# Patient Record
Sex: Female | Born: 1970 | Race: Black or African American | Hispanic: No | Marital: Single | Smoking: Former smoker
Health system: Southern US, Community
[De-identification: ages and names within clinical notes are randomized; demographics above are authoritative.]

## PROBLEM LIST (undated history)

## (undated) DIAGNOSIS — O24419 Gestational diabetes mellitus in pregnancy, unspecified control: Secondary | ICD-10-CM

## (undated) DIAGNOSIS — G500 Trigeminal neuralgia: Secondary | ICD-10-CM

## (undated) DIAGNOSIS — I10 Essential (primary) hypertension: Secondary | ICD-10-CM

## (undated) DIAGNOSIS — Z6841 Body Mass Index (BMI) 40.0 and over, adult: Secondary | ICD-10-CM

## (undated) DIAGNOSIS — J189 Pneumonia, unspecified organism: Secondary | ICD-10-CM

## (undated) DIAGNOSIS — Z8742 Personal history of other diseases of the female genital tract: Secondary | ICD-10-CM

## (undated) HISTORY — PX: LAPAROSCOPIC TUBAL LIGATION: SHX1937

---

## 2009-04-11 ENCOUNTER — Inpatient Hospital Stay (HOSPITAL_COMMUNITY): Admission: AD | Admit: 2009-04-11 | Discharge: 2009-04-11 | Payer: Self-pay | Admitting: Obstetrics and Gynecology

## 2009-05-31 ENCOUNTER — Ambulatory Visit: Payer: Self-pay | Admitting: Obstetrics and Gynecology

## 2009-05-31 ENCOUNTER — Inpatient Hospital Stay (HOSPITAL_COMMUNITY): Admission: AD | Admit: 2009-05-31 | Discharge: 2009-05-31 | Payer: Self-pay | Admitting: Obstetrics & Gynecology

## 2009-06-05 ENCOUNTER — Encounter: Payer: Self-pay | Admitting: Family

## 2009-06-05 ENCOUNTER — Ambulatory Visit: Payer: Self-pay | Admitting: Obstetrics & Gynecology

## 2009-06-05 LAB — CONVERTED CEMR LAB
Basophils Absolute: 0 10*3/uL (ref 0.0–0.1)
Basophils Relative: 0 % (ref 0–1)
Eosinophils Absolute: 0.1 10*3/uL (ref 0.0–0.7)
Hepatitis B Surface Ag: NEGATIVE
MCHC: 31.4 g/dL (ref 30.0–36.0)
MCV: 86.6 fL (ref 78.0–100.0)
Monocytes Relative: 4 % (ref 3–12)
Neutro Abs: 6.4 10*3/uL (ref 1.7–7.7)
Neutrophils Relative %: 73 % (ref 43–77)
Platelets: 303 10*3/uL (ref 150–400)
RBC: 3.67 M/uL — ABNORMAL LOW (ref 3.87–5.11)

## 2009-06-06 ENCOUNTER — Ambulatory Visit (HOSPITAL_COMMUNITY): Admission: RE | Admit: 2009-06-06 | Discharge: 2009-06-06 | Payer: Self-pay

## 2009-06-12 ENCOUNTER — Encounter: Payer: Self-pay | Admitting: Obstetrics and Gynecology

## 2009-06-12 ENCOUNTER — Ambulatory Visit: Payer: Self-pay | Admitting: Obstetrics & Gynecology

## 2009-06-12 LAB — CONVERTED CEMR LAB
Alkaline Phosphatase: 45 units/L (ref 39–117)
BUN: 5 mg/dL — ABNORMAL LOW (ref 6–23)
CO2: 22 meq/L (ref 19–32)
Creatinine, Ser: 0.59 mg/dL (ref 0.40–1.20)
Glucose, Bld: 106 mg/dL — ABNORMAL HIGH (ref 70–99)
HCT: 28.8 % — ABNORMAL LOW (ref 36.0–46.0)
Hemoglobin: 9.4 g/dL — ABNORMAL LOW (ref 12.0–15.0)
MCHC: 32.6 g/dL (ref 30.0–36.0)
MCV: 85.7 fL (ref 78.0–100.0)
RBC: 3.36 M/uL — ABNORMAL LOW (ref 3.87–5.11)
Sodium: 138 meq/L (ref 135–145)
Total Bilirubin: 0.2 mg/dL — ABNORMAL LOW (ref 0.3–1.2)
Total Protein: 6.5 g/dL (ref 6.0–8.3)
Uric Acid, Serum: 5.8 mg/dL (ref 2.4–7.0)
WBC: 8 10*3/uL (ref 4.0–10.5)

## 2009-06-14 ENCOUNTER — Ambulatory Visit (HOSPITAL_COMMUNITY): Admission: RE | Admit: 2009-06-14 | Discharge: 2009-06-14 | Payer: Self-pay | Admitting: Obstetrics and Gynecology

## 2009-06-17 ENCOUNTER — Ambulatory Visit: Payer: Self-pay | Admitting: Obstetrics & Gynecology

## 2009-06-17 LAB — CONVERTED CEMR LAB
Creatinine 24 HR UR: 1719 mg/24hr (ref 700–1800)
Creatinine, Ser: 0.54 mg/dL (ref 0.40–1.20)
Creatinine, Urine: 196.4 mg/dL

## 2009-07-01 ENCOUNTER — Ambulatory Visit: Payer: Self-pay | Admitting: Obstetrics & Gynecology

## 2009-07-02 ENCOUNTER — Ambulatory Visit (HOSPITAL_COMMUNITY): Admission: RE | Admit: 2009-07-02 | Discharge: 2009-07-02 | Payer: Self-pay | Admitting: Obstetrics & Gynecology

## 2009-07-02 ENCOUNTER — Encounter: Payer: Self-pay | Admitting: Family

## 2009-07-02 LAB — CONVERTED CEMR LAB: Yeast Wet Prep HPF POC: NONE SEEN

## 2009-07-04 ENCOUNTER — Inpatient Hospital Stay (HOSPITAL_COMMUNITY): Admission: AD | Admit: 2009-07-04 | Discharge: 2009-07-04 | Payer: Self-pay | Admitting: Obstetrics & Gynecology

## 2009-07-04 ENCOUNTER — Ambulatory Visit: Payer: Self-pay | Admitting: Advanced Practice Midwife

## 2009-07-08 ENCOUNTER — Emergency Department (HOSPITAL_COMMUNITY): Admission: EM | Admit: 2009-07-08 | Discharge: 2009-07-08 | Payer: Self-pay | Admitting: Emergency Medicine

## 2009-07-15 ENCOUNTER — Ambulatory Visit: Payer: Self-pay | Admitting: Obstetrics & Gynecology

## 2009-07-23 ENCOUNTER — Ambulatory Visit (HOSPITAL_COMMUNITY): Admission: RE | Admit: 2009-07-23 | Discharge: 2009-07-23 | Payer: Self-pay | Admitting: Obstetrics & Gynecology

## 2009-07-26 ENCOUNTER — Ambulatory Visit (HOSPITAL_COMMUNITY): Admission: RE | Admit: 2009-07-26 | Discharge: 2009-07-26 | Payer: Self-pay | Admitting: Obstetrics & Gynecology

## 2009-07-29 ENCOUNTER — Ambulatory Visit: Payer: Self-pay | Admitting: Obstetrics & Gynecology

## 2009-08-02 ENCOUNTER — Ambulatory Visit (HOSPITAL_COMMUNITY): Admission: RE | Admit: 2009-08-02 | Discharge: 2009-08-02 | Payer: Self-pay | Admitting: Obstetrics & Gynecology

## 2009-08-05 ENCOUNTER — Ambulatory Visit: Payer: Self-pay | Admitting: Obstetrics & Gynecology

## 2009-08-09 ENCOUNTER — Ambulatory Visit (HOSPITAL_COMMUNITY): Admission: RE | Admit: 2009-08-09 | Discharge: 2009-08-09 | Payer: Self-pay | Admitting: Obstetrics & Gynecology

## 2009-08-12 ENCOUNTER — Ambulatory Visit: Payer: Self-pay | Admitting: Obstetrics & Gynecology

## 2009-08-19 ENCOUNTER — Encounter: Payer: Self-pay | Admitting: Obstetrics & Gynecology

## 2009-08-19 ENCOUNTER — Ambulatory Visit: Payer: Self-pay | Admitting: Obstetrics & Gynecology

## 2009-08-20 ENCOUNTER — Encounter: Payer: Self-pay | Admitting: Obstetrics & Gynecology

## 2009-08-20 LAB — CONVERTED CEMR LAB
Trich, Wet Prep: NONE SEEN
Yeast Wet Prep HPF POC: NONE SEEN

## 2009-08-23 ENCOUNTER — Ambulatory Visit (HOSPITAL_COMMUNITY): Admission: RE | Admit: 2009-08-23 | Discharge: 2009-08-23 | Payer: Self-pay | Admitting: Obstetrics & Gynecology

## 2009-08-26 ENCOUNTER — Encounter: Admission: RE | Admit: 2009-08-26 | Discharge: 2009-10-30 | Payer: Self-pay | Admitting: Obstetrics & Gynecology

## 2009-08-26 ENCOUNTER — Ambulatory Visit: Payer: Self-pay | Admitting: Obstetrics & Gynecology

## 2009-09-02 ENCOUNTER — Ambulatory Visit: Payer: Self-pay | Admitting: Family Medicine

## 2009-09-02 ENCOUNTER — Encounter: Payer: Self-pay | Admitting: Obstetrics & Gynecology

## 2009-09-02 LAB — CONVERTED CEMR LAB
Hemoglobin: 10.1 g/dL — ABNORMAL LOW (ref 12.0–15.0)
MCHC: 31.5 g/dL (ref 30.0–36.0)
MCV: 89.7 fL (ref 78.0–100.0)
RBC: 3.58 M/uL — ABNORMAL LOW (ref 3.87–5.11)
RDW: 15 % (ref 11.5–15.5)

## 2009-09-09 ENCOUNTER — Ambulatory Visit: Payer: Self-pay | Admitting: Obstetrics & Gynecology

## 2009-09-13 ENCOUNTER — Ambulatory Visit (HOSPITAL_COMMUNITY): Admission: RE | Admit: 2009-09-13 | Discharge: 2009-09-13 | Payer: Self-pay | Admitting: Obstetrics & Gynecology

## 2009-09-16 ENCOUNTER — Ambulatory Visit: Payer: Self-pay | Admitting: Obstetrics & Gynecology

## 2009-09-23 ENCOUNTER — Ambulatory Visit: Payer: Self-pay | Admitting: Obstetrics & Gynecology

## 2009-09-25 ENCOUNTER — Ambulatory Visit: Payer: Self-pay | Admitting: Obstetrics & Gynecology

## 2009-09-30 ENCOUNTER — Ambulatory Visit: Payer: Self-pay | Admitting: Obstetrics & Gynecology

## 2009-10-04 ENCOUNTER — Ambulatory Visit (HOSPITAL_COMMUNITY): Admission: RE | Admit: 2009-10-04 | Discharge: 2009-10-04 | Payer: Self-pay | Admitting: Obstetrics & Gynecology

## 2009-10-04 ENCOUNTER — Ambulatory Visit: Payer: Self-pay | Admitting: Obstetrics & Gynecology

## 2009-10-07 ENCOUNTER — Inpatient Hospital Stay (HOSPITAL_COMMUNITY): Admission: AD | Admit: 2009-10-07 | Discharge: 2009-10-07 | Payer: Self-pay | Admitting: Obstetrics & Gynecology

## 2009-10-07 ENCOUNTER — Ambulatory Visit: Payer: Self-pay | Admitting: Advanced Practice Midwife

## 2009-10-07 ENCOUNTER — Ambulatory Visit: Payer: Self-pay | Admitting: Obstetrics and Gynecology

## 2009-10-08 ENCOUNTER — Encounter: Payer: Self-pay | Admitting: Obstetrics & Gynecology

## 2009-10-09 ENCOUNTER — Ambulatory Visit: Payer: Self-pay | Admitting: Obstetrics & Gynecology

## 2009-10-09 ENCOUNTER — Encounter: Payer: Self-pay | Admitting: Obstetrics & Gynecology

## 2009-10-09 LAB — CONVERTED CEMR LAB
Creatinine 24 HR UR: 1819 mg/24hr — ABNORMAL HIGH (ref 700–1800)
Creatinine Clearance: 211 mL/min — ABNORMAL HIGH (ref 75–115)

## 2009-10-11 ENCOUNTER — Ambulatory Visit (HOSPITAL_COMMUNITY): Admission: RE | Admit: 2009-10-11 | Discharge: 2009-10-11 | Payer: Self-pay | Admitting: Family Medicine

## 2009-10-11 ENCOUNTER — Ambulatory Visit: Payer: Self-pay | Admitting: Obstetrics & Gynecology

## 2009-10-14 ENCOUNTER — Ambulatory Visit: Payer: Self-pay | Admitting: Obstetrics & Gynecology

## 2009-10-14 ENCOUNTER — Encounter: Payer: Self-pay | Admitting: Obstetrics & Gynecology

## 2009-10-17 ENCOUNTER — Ambulatory Visit: Payer: Self-pay | Admitting: Advanced Practice Midwife

## 2009-10-17 ENCOUNTER — Inpatient Hospital Stay (HOSPITAL_COMMUNITY): Admission: AD | Admit: 2009-10-17 | Discharge: 2009-10-17 | Payer: Self-pay | Admitting: Obstetrics & Gynecology

## 2009-10-21 ENCOUNTER — Ambulatory Visit: Payer: Self-pay | Admitting: Obstetrics and Gynecology

## 2009-10-21 ENCOUNTER — Ambulatory Visit: Payer: Self-pay | Admitting: Family Medicine

## 2009-10-21 ENCOUNTER — Inpatient Hospital Stay (HOSPITAL_COMMUNITY): Admission: AD | Admit: 2009-10-21 | Discharge: 2009-10-23 | Payer: Self-pay | Admitting: Obstetrics & Gynecology

## 2009-10-22 ENCOUNTER — Encounter: Payer: Self-pay | Admitting: Obstetrics & Gynecology

## 2009-10-22 LAB — CONVERTED CEMR LAB
Chlamydia, DNA Probe: NEGATIVE
GC Probe Amp, Genital: NEGATIVE

## 2009-10-23 ENCOUNTER — Encounter: Payer: Self-pay | Admitting: Obstetrics & Gynecology

## 2009-10-24 ENCOUNTER — Ambulatory Visit: Payer: Self-pay | Admitting: Obstetrics & Gynecology

## 2009-10-24 ENCOUNTER — Encounter: Payer: Self-pay | Admitting: Obstetrics & Gynecology

## 2009-10-24 ENCOUNTER — Ambulatory Visit (HOSPITAL_COMMUNITY): Admission: RE | Admit: 2009-10-24 | Discharge: 2009-10-24 | Payer: Self-pay | Admitting: Obstetrics & Gynecology

## 2009-10-24 LAB — CONVERTED CEMR LAB
ALT: 10 units/L (ref 0–35)
AST: 15 units/L (ref 0–37)
Albumin: 3.3 g/dL — ABNORMAL LOW (ref 3.5–5.2)
Calcium: 9 mg/dL (ref 8.4–10.5)
Chloride: 105 meq/L (ref 96–112)
Platelets: 257 10*3/uL (ref 150–400)
Potassium: 4 meq/L (ref 3.5–5.3)
RDW: 15.1 % (ref 11.5–15.5)
Sodium: 137 meq/L (ref 135–145)
Total Protein: 6.7 g/dL (ref 6.0–8.3)
WBC: 7.6 10*3/uL (ref 4.0–10.5)

## 2009-10-25 ENCOUNTER — Encounter: Payer: Self-pay | Admitting: Obstetrics & Gynecology

## 2009-10-25 LAB — CONVERTED CEMR LAB: Creatinine Clearance: 204 mL/min — ABNORMAL HIGH (ref 75–115)

## 2009-10-28 ENCOUNTER — Inpatient Hospital Stay (HOSPITAL_COMMUNITY): Admission: AD | Admit: 2009-10-28 | Discharge: 2009-10-29 | Payer: Self-pay | Admitting: Obstetrics & Gynecology

## 2009-11-04 ENCOUNTER — Ambulatory Visit: Payer: Self-pay | Admitting: Obstetrics and Gynecology

## 2009-11-04 ENCOUNTER — Ambulatory Visit: Payer: Self-pay | Admitting: Obstetrics & Gynecology

## 2009-11-04 ENCOUNTER — Inpatient Hospital Stay (HOSPITAL_COMMUNITY): Admission: AD | Admit: 2009-11-04 | Discharge: 2009-11-07 | Payer: Self-pay | Admitting: Family Medicine

## 2009-11-25 ENCOUNTER — Ambulatory Visit: Payer: Self-pay | Admitting: Family Medicine

## 2009-11-25 DIAGNOSIS — E669 Obesity, unspecified: Secondary | ICD-10-CM

## 2009-11-25 DIAGNOSIS — I10 Essential (primary) hypertension: Secondary | ICD-10-CM

## 2009-11-25 DIAGNOSIS — R7309 Other abnormal glucose: Secondary | ICD-10-CM

## 2009-11-25 DIAGNOSIS — G500 Trigeminal neuralgia: Secondary | ICD-10-CM | POA: Insufficient documentation

## 2009-11-25 DIAGNOSIS — Z8742 Personal history of other diseases of the female genital tract: Secondary | ICD-10-CM

## 2009-11-25 DIAGNOSIS — R300 Dysuria: Secondary | ICD-10-CM

## 2009-11-25 DIAGNOSIS — N3000 Acute cystitis without hematuria: Secondary | ICD-10-CM | POA: Insufficient documentation

## 2009-11-25 HISTORY — DX: Personal history of other diseases of the female genital tract: Z87.42

## 2009-11-25 LAB — CONVERTED CEMR LAB
Bilirubin Urine: NEGATIVE
Glucose, Urine, Semiquant: NEGATIVE
Hgb A1c MFr Bld: 6.3 %
Ketones, urine, test strip: NEGATIVE
Specific Gravity, Urine: 1.015
Urobilinogen, UA: 0.2
Whiff Test: NEGATIVE
pH: 6

## 2010-11-23 ENCOUNTER — Encounter: Payer: Self-pay | Admitting: Obstetrics & Gynecology

## 2010-11-23 ENCOUNTER — Encounter: Payer: Self-pay | Admitting: *Deleted

## 2010-11-24 ENCOUNTER — Encounter: Payer: Self-pay | Admitting: Obstetrics & Gynecology

## 2010-11-24 ENCOUNTER — Encounter: Payer: Self-pay | Admitting: *Deleted

## 2010-12-02 NOTE — Assessment & Plan Note (Signed)
Summary: hfu,df   Vital Signs:  Patient profile:   40 year old female Height:      61 inches Weight:      291.5 pounds BMI:     55.28 Temp:     98 degrees F oral Pulse rate:   86 / minute Pulse rhythm:   regular BP sitting:   132 / 90  (left arm) Cuff size:   large  Vitals Entered By: Loralee Pacas CMA (November 25, 2009 11:17 AM) CC: F/U hospital   CC:  F/U hospital.  History of Present Illness: Ms Penton presents to clinic today for f/u from her hospital discharge and to establish a PCP.   Gestational Diabetes: On glipizide with fasting blood sugars on average in the 70s.  She feels well.  Hypertension: Is taking aldomet and an unknown BP medication that is likely lisinopril. She denies any palpitations, chest pain, dyspnea, headache or dizzyness.   Dysurea: Urinary frequency and burning for the last few days.  No blood in the urine or fever, or chills. No back pain.  Postpartum: Ms Collings plans to go to the health department for her 6 week check.  She has no current issues.   Current Problems (verified): 1)  Hypertension, Benign Essential  (ICD-401.1) 2)  Obesity, Unspecified  (ICD-278.00) 3)  Diabetes Mellitus, Gestational, Hx of  (ICD-V13.29) 4)  Dysuria  (ICD-788.1) 5)  Other Abnormal Glucose  (ICD-790.29)  Current Medications (verified): 1)  Trileptal 300 Mg Tabs (Oxcarbazepine) .... 3 Pills By Mouth Three Times A Day. 2)  Methyldopa 500 Mg Tabs (Methyldopa) .Marland Kitchen.. 1 By By Mouth Bid 3)  Glipizide 5 Mg Tabs (Glipizide) .Marland Kitchen.. 1 By Mouth Bid 4)  Lisinopril 10 Mg Tabs (Lisinopril) .Marland Kitchen.. 1 By Mouth Qd 5)  Keflex 500 Mg Caps (Cephalexin) .Marland Kitchen.. 1 By Mouth Bid  Allergies (verified): No Known Drug Allergies  Past History:  Family History: Last updated: 11/25/2009 HTN  Social History: Last updated: 11/25/2009 Lives in [place]  Past Medical History: Trigeminal Neurolagia Obesity Gestational DM Hypertension  Past Surgical History: BTL 11/04/09  Family  History: HTN  Social History: Lives in [place]  Review of Systems       Please see HPI  Physical Exam  General:  VS noted Obese woman in NAD Lungs:  CTABL Heart:  RRR no MRG Abdomen:  Non distended, NABS, soft, nontender, no guarding or rebound.  BTL incision is well healled. No CV tenderness. Genitalia:  normal introitus, no external lesions, no vaginal discharge, no vaginal or cervical lesions, and no friaility or hemorrhage.   Extremities:  Non edemetus BL LE   Impression & Recommendations:  Problem # 1:  HYPERTENSION, BENIGN ESSENTIAL (ICD-401.1) Assessment Unchanged  BP appears well controlled.  Will stop  methyldopa and ask Ms Luo to call with the name of her BP medication.   Will reassess BP with a nurse visit later this week and adjust medications as needed. Will assess lipids and a CMP with a fasting visit later this week. Her updated medication list for this problem includes:    Methyldopa 500 Mg Tabs (Methyldopa) .Marland Kitchen... 1 by by mouth bid    Lisinopril 10 Mg Tabs (Lisinopril) .Marland Kitchen... 1 by mouth qd  BP today: 132/90  Labs Reviewed: K+: 4.0 (10/24/2009) Creat: : 0.64 (10/24/2009)     Orders: [facility]- Est Level  3 (84696)  Problem # 2:  DYSURIA (ICD-788.1) Assessment: Unchanged  Appears to be a UTI based on UA.  Will treat with Keflex  and reassess.  Her updated medication list for this problem includes:    Keflex 500 Mg Caps (Cephalexin) .Marland Kitchen... 1 by mouth bid  Orders: Urinalysis-[facility] (00000) Wet Prep- [facility] (02725) [facility]- Est Level  3 (36644)  Complete Medication List: 1)  Trileptal 300 Mg Tabs (Oxcarbazepine) .... 3 pills by mouth three times a day. 2)  Methyldopa 500 Mg Tabs (Methyldopa) .Marland Kitchen.. 1 by by mouth bid 3)  Glipizide 5 Mg Tabs (Glipizide) .Marland Kitchen.. 1 by mouth bid 4)  Lisinopril 10 Mg Tabs (Lisinopril) .Marland Kitchen.. 1 by mouth qd 5)  Keflex 500 Mg Caps (Cephalexin) .Marland Kitchen.. 1 by mouth bid  Other Orders: A1C-[facility] (03474) Influenza Vaccine NON MCR (25956)  Patient  Instructions: 1)  Stop taking the Aldomet, and Glipizide. 2)  Please come back in the morning this week for fasting labs and a blood pressure check. Do not eat or drink anything but water that morning. 3)  Please call me and leave a message with your blood pressure medicine names and doses. 4)  Please let me know how the post partum check at the health department goes. 5)  Please f/u with me in 3 months. 6)  If you have chest pain, difficulty breathing, fevers over 102 that does not get better with tylenol please call us or see a doctor.  Prescriptions: KEFLEX 500 MG CAPS (CEPHALEXIN) 1 by mouth bid  #14 x 0   Entered and Authorized by:   Clementeen Graham MD   Signed by:   Clementeen Graham MD on 11/25/2009   Method used:   Print then Give to Patient   RxID:   [id number]   Laboratory Results   Urine Tests  Date/Time Received: November 25, 2009 11:44 AM  Date/Time Reported: November 25, 2009 12:12 PM   Routine Urinalysis   Color: yellow Appearance: Cloudy Glucose: negative   (Normal Range: Negative) Bilirubin: negative   (Normal Range: Negative) Ketone: negative   (Normal Range: Negative) Spec. Gravity: 1.015   (Normal Range: 1.003-1.035) Blood: moderate   (Normal Range: Negative) pH: 6.0   (Normal Range: 5.0-8.0) Protein: 30   (Normal Range: Negative) Urobilinogen: 0.2   (Normal Range: 0-1) Nitrite: negative   (Normal Range: Negative) Leukocyte Esterace: large   (Normal Range: Negative)  Urine Microscopic WBC/HPF: loaded RBC/HPF: 10-20 Bacteria/HPF: 2+ Epithelial/HPF: 1-5    Comments: ...............test performed by......Marland KitchenBonnie A. [place], MLS (ASCP)cm   Blood Tests   Date/Time Received: November 25, 2009 11:15 AM  Date/Time Reported: November 25, 2009 11:32 AM   HGBA1C: 6.3%   (Normal Range: Non-Diabetic - 3-6%   Control Diabetic - 6-8%)  Comments: ...............test performed by......Marland KitchenBonnie A. [place], MLS (ASCP)cm  Date/Time Received: November 25, 2009 11:44  AM  Date/Time Reported: November 25, 2009 11:50 AM   [employer] Source: Vaginal WBC/hpf: 0-3 Bacteria/hpf: 2+  Rods Clue cells/hpf: none  Negative whiff Yeast/hpf: none Trichomonas/hpf: none Comments: 0-3 RBC's present ...........test performed by...........Marland KitchenTerese Door, CMA     Prevention & Chronic Care Immunizations   Influenza vaccine: Fluvax Non-MCR  (11/25/2009)    Tetanus booster: Not documented    Pneumococcal vaccine: Not documented  Other Screening   Pap smear: NEGATIVE FOR INTRAEPITHELIAL LESIONS OR MALIGNANCY.  (06/05/2009)   Smoking status: Not documented  Lipids   Total Cholesterol: Not documented   LDL: Not documented   LDL Direct: Not documented   HDL: Not documented   Triglycerides: Not documented  Hypertension   Last Blood Pressure: 132 / 90  (11/25/2009)   Serum creatinine: 0.64  (  10/24/2009)   Serum potassium 4.0  (10/24/2009)  Self-Management Support :    Hypertension self-management support: Not documented     Immunizations Administered:  Influenza Vaccine # 1:    Vaccine Type: Fluvax Non-MCR    Site: left deltoid    Mfr: GlaxoSmithKline    Dose: 0.5 ml    Route: IM    Given by: Loralee Pacas CMA    Exp. Date: 05/01/2010    Lot #: [id number]    VIS given: 06/12/2007  Flu Vaccine Consent Questions:    Do you have a history of severe allergic reactions to this vaccine? no    Any prior history of allergic reactions to egg and/or gelatin? no    Do you have a sensitivity to the preservative Thimersol? no    Do you have a past history of Guillan-Barre Syndrome? no    Do you currently have an acute febrile illness? no    Have you ever had a severe reaction to latex? no    Vaccine information given and explained to patient? yes    Are you currently pregnant? no  flu shot was not given, charted on incorrect pt.Loralee Pacas CMA  November 26, 2009 8:35 AM

## 2010-12-23 IMAGING — US US OB TRANSVAGINAL MODIFY
1 series · 18 of 28 positions shown · non-contrast
Comparison: none

OBSTETRICAL ULTRASOUND:
 This ultrasound was performed in The [HOSPITAL], and the [facility] report will be stored to [REDACTED] PACS.

[Series 1: us ob transvaginal modify · 18 of 52 slices shown]
[im 1/52]
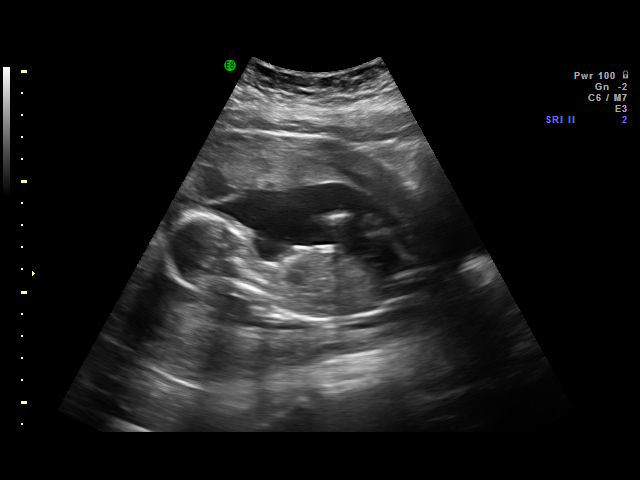
[im 4/52]
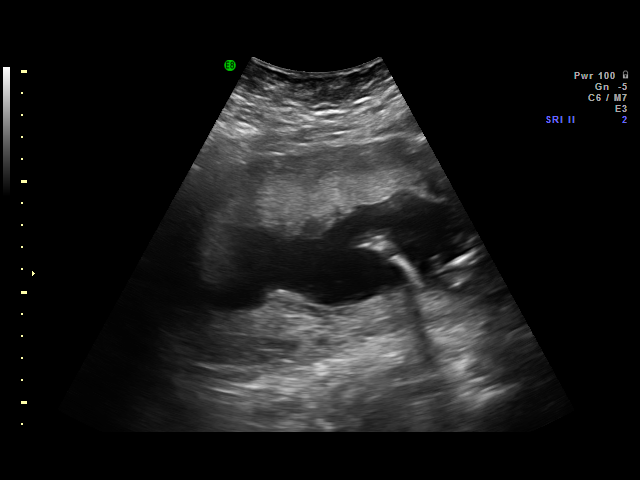
[im 6/52]
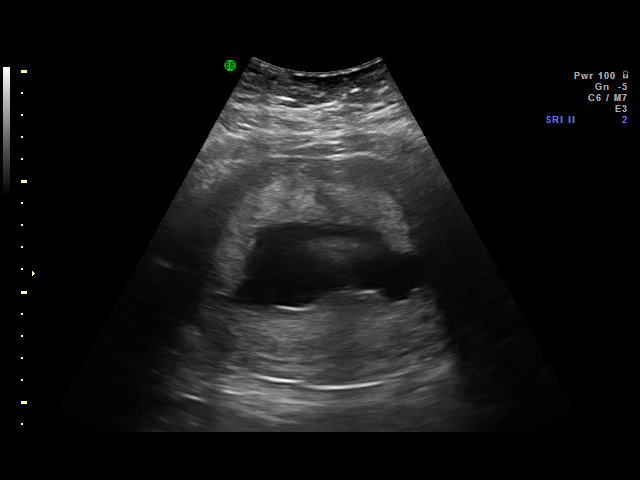
[im 10/52]
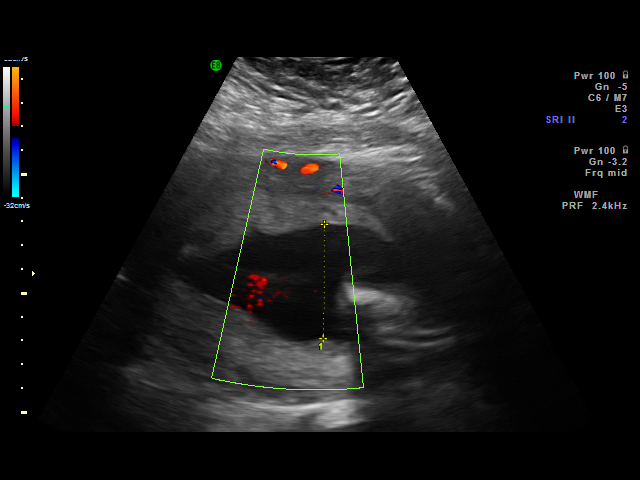
[im 14/52]
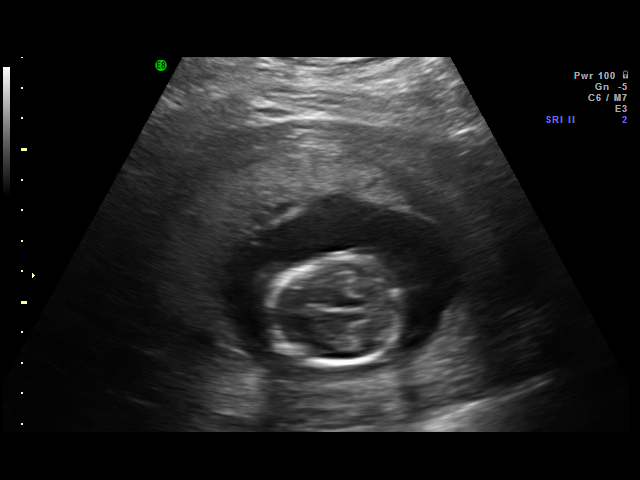
[im 16/52]
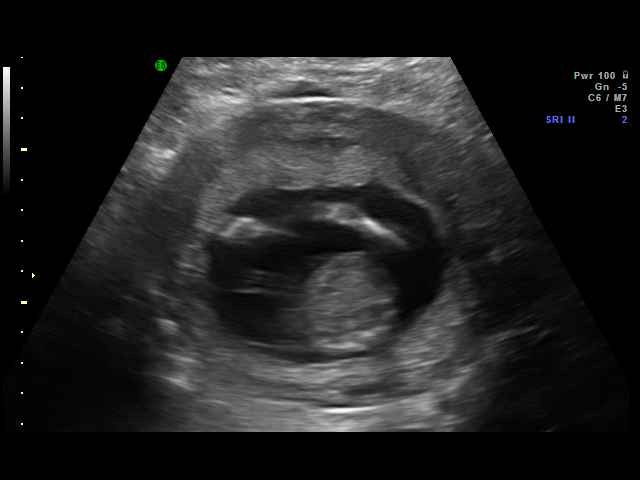
[im 19/52]
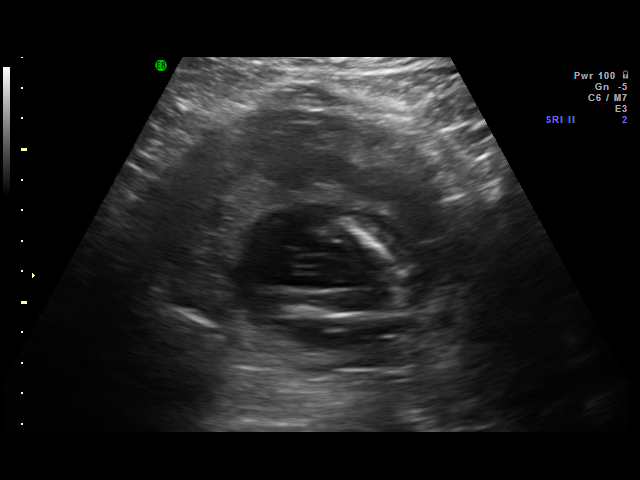
[im 21/52]
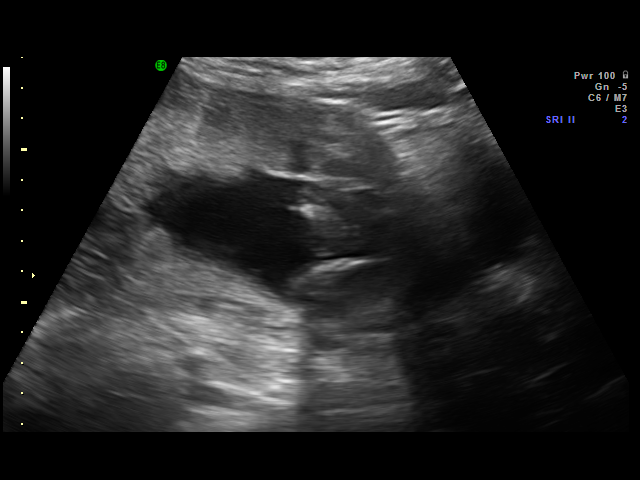
[im 25/52]
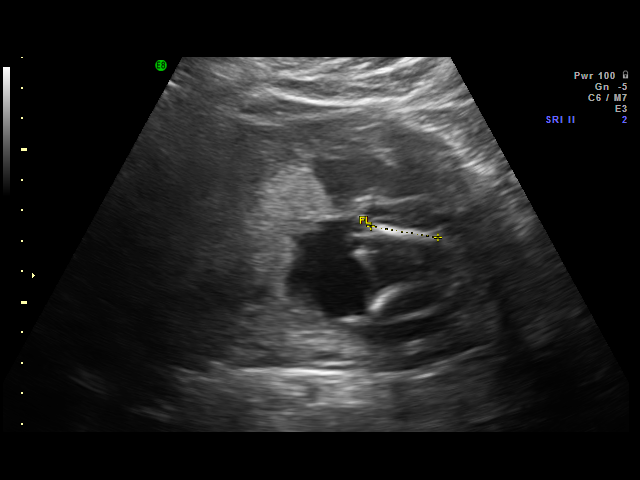
[im 27/52]
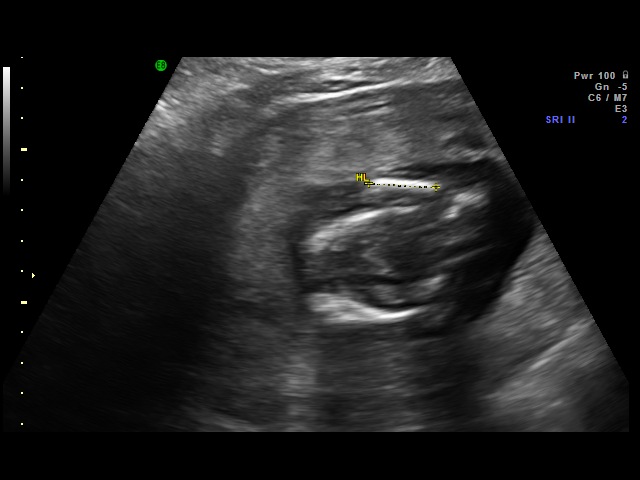
[im 31/52]
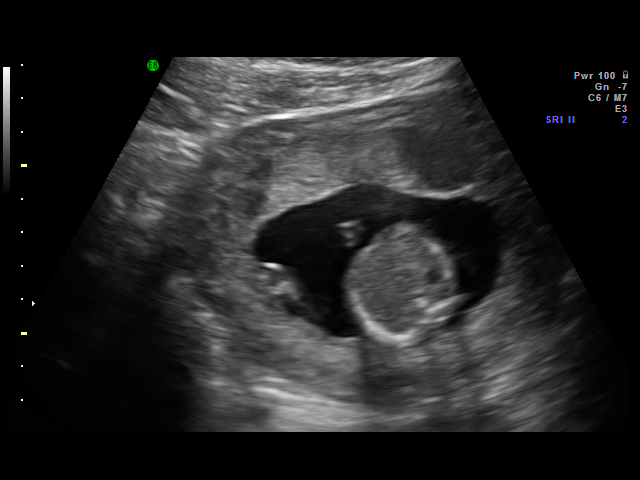
[im 33/52]
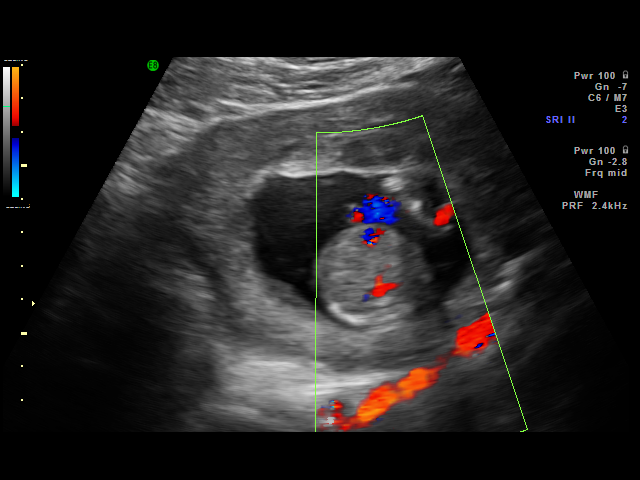
[im 36/52]
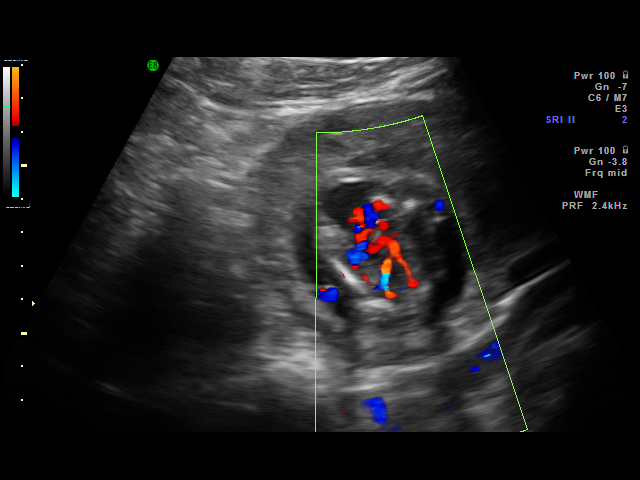
[im 40/52]
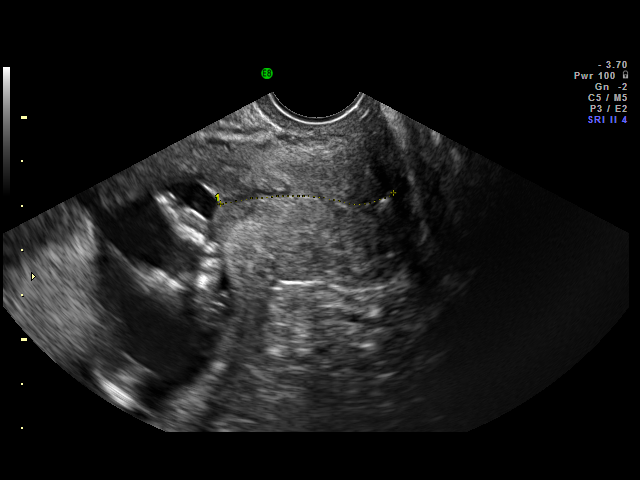
[im 42/52]
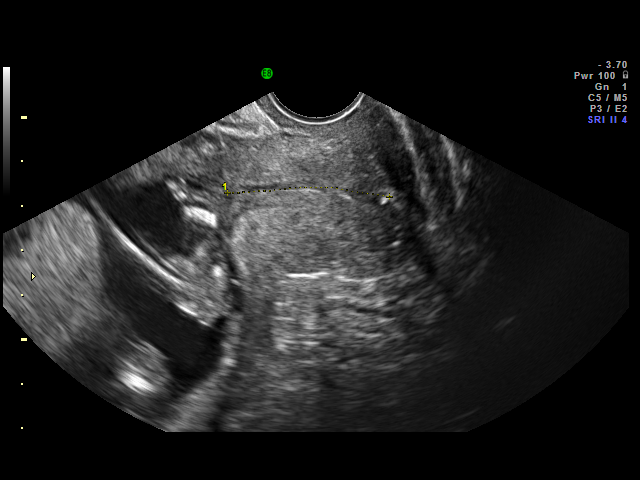
[im 46/52]
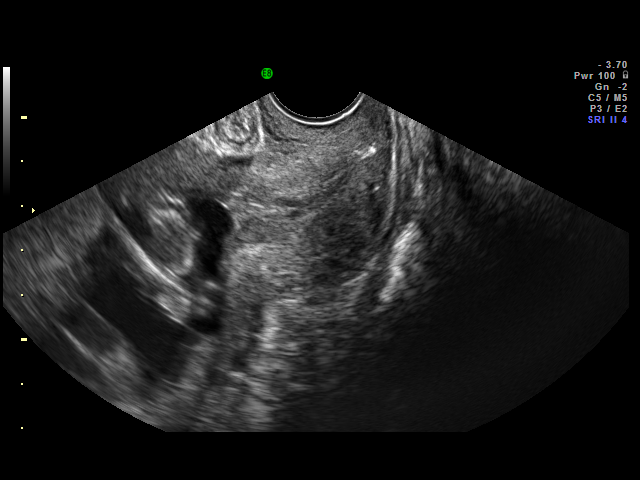
[im 48/52]
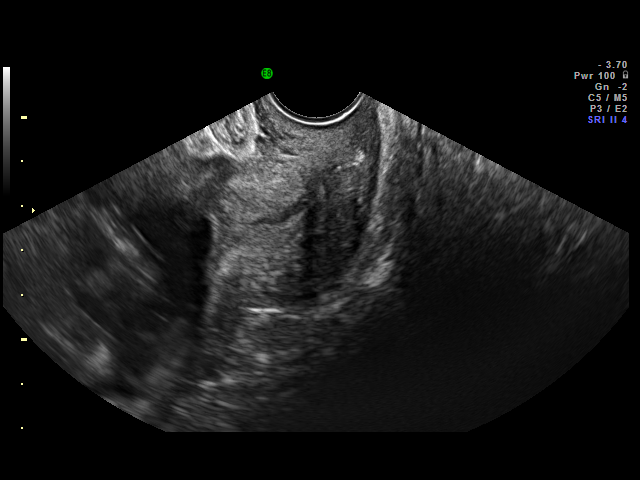
[im 52/52]
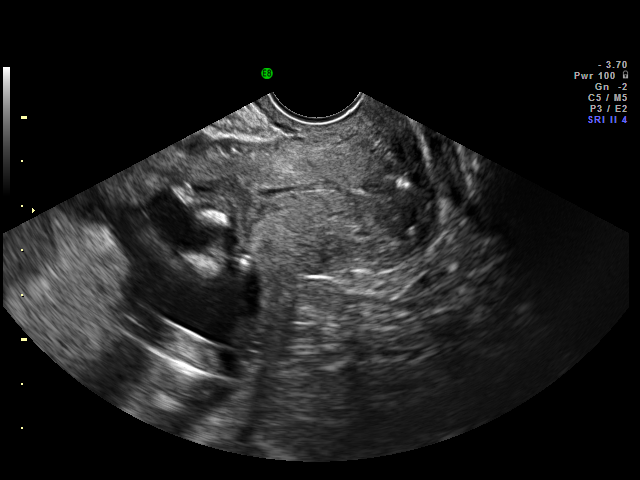

[18 of 28 positions shown; findings below may reference images not displayed]

IMPRESSION: [facility] has also been faxed to the ordering physician.

## 2010-12-31 IMAGING — US US OB TRANSVAGINAL
1 series · 18 of 24 positions shown · non-contrast
Comparison: none

OBSTETRICAL ULTRASOUND:
 This ultrasound was performed in The [HOSPITAL], and the [facility] report will be stored to [REDACTED] PACS.

[Series 1: us ob transvaginal · 18 of 24 slices shown]
[im 1/24]
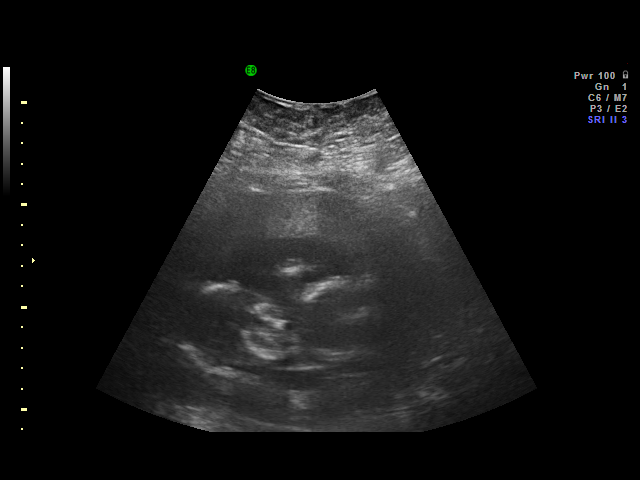
[im 3/24]
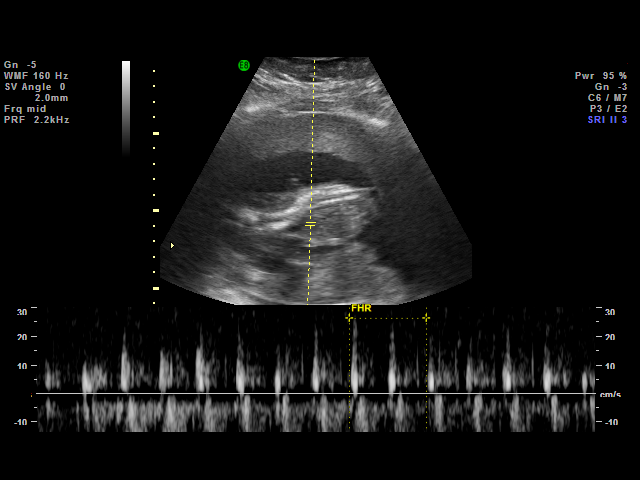
[im 4/24]
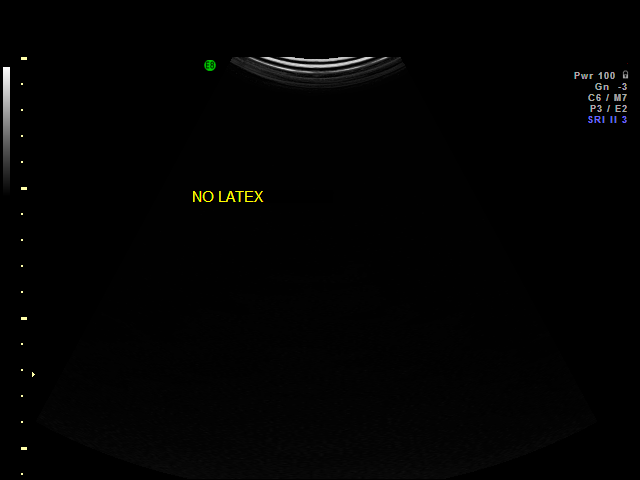
[im 5/24]
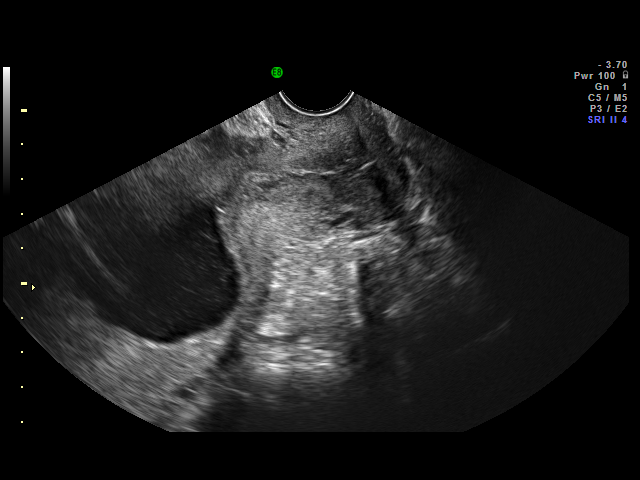
[im 7/24]
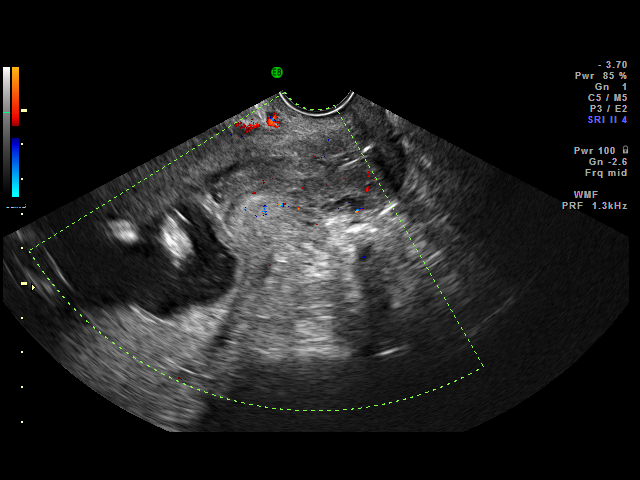
[im 8/24]
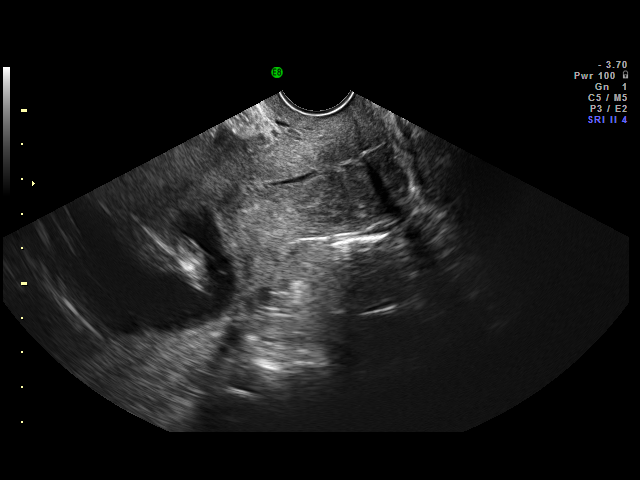
[im 9/24]
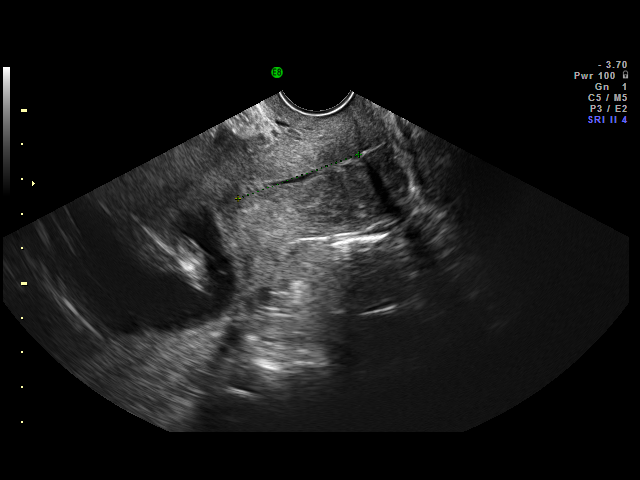
[im 11/24]
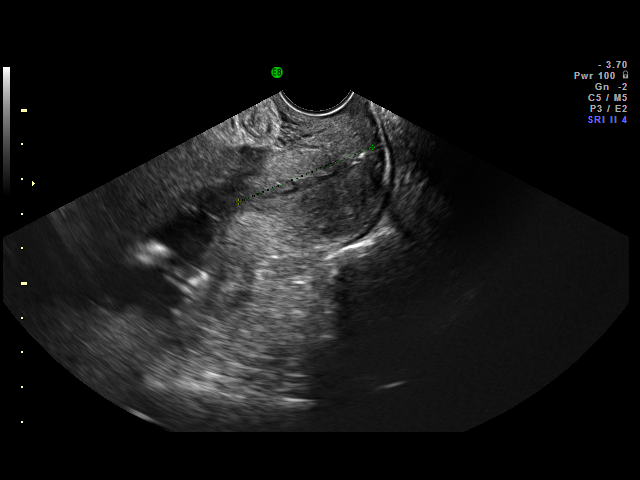
[im 12/24]
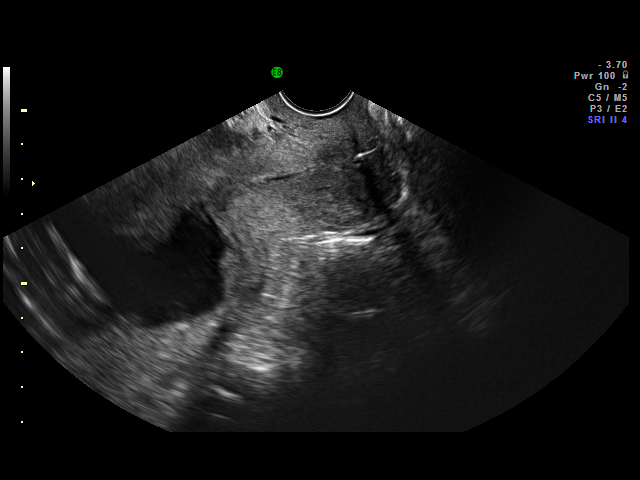
[im 13/24]
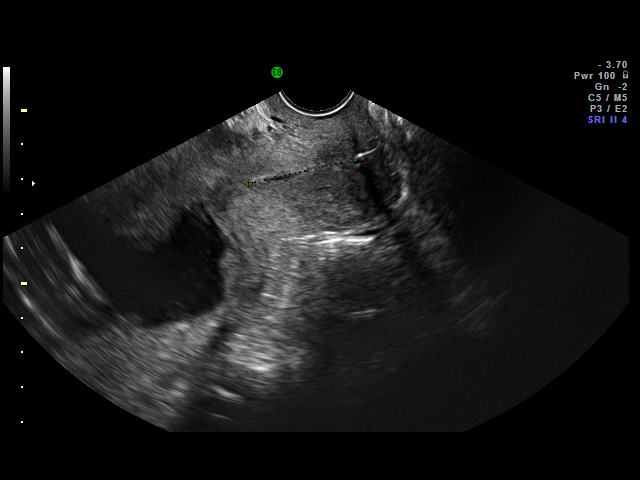
[im 15/24]
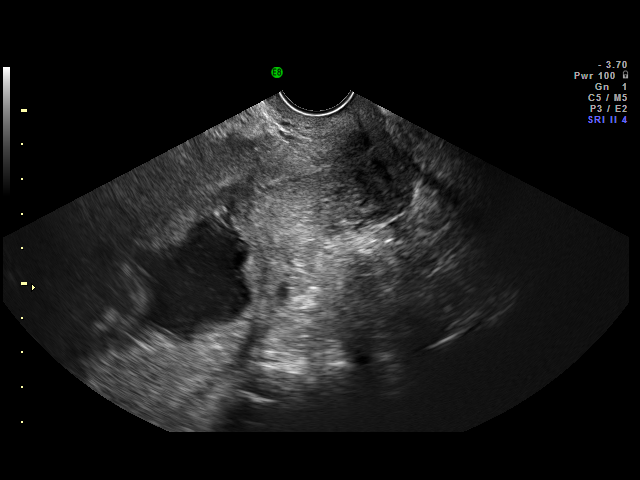
[im 16/24]
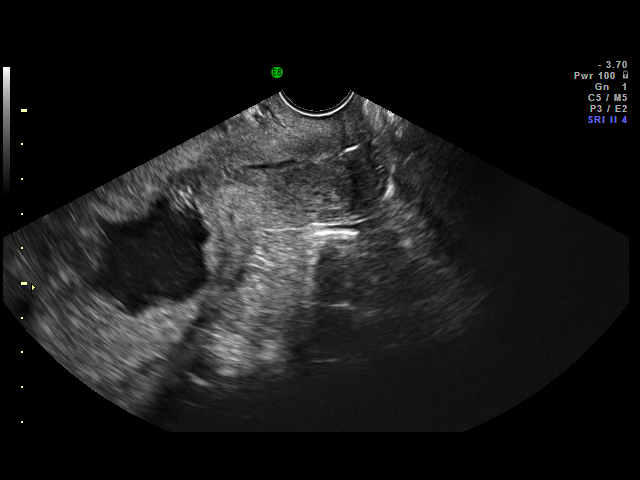
[im 17/24]
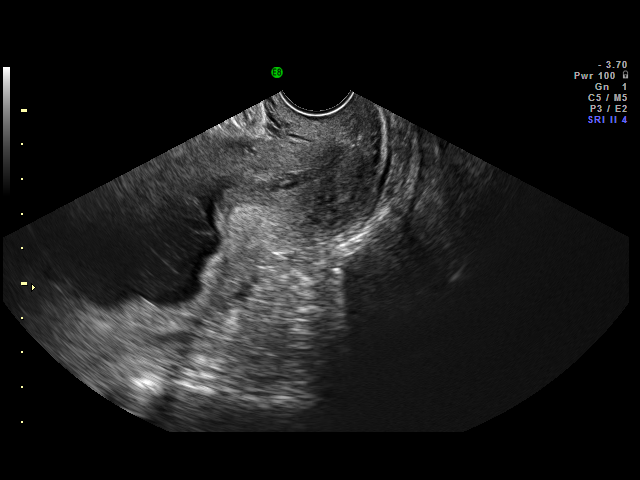
[im 19/24]
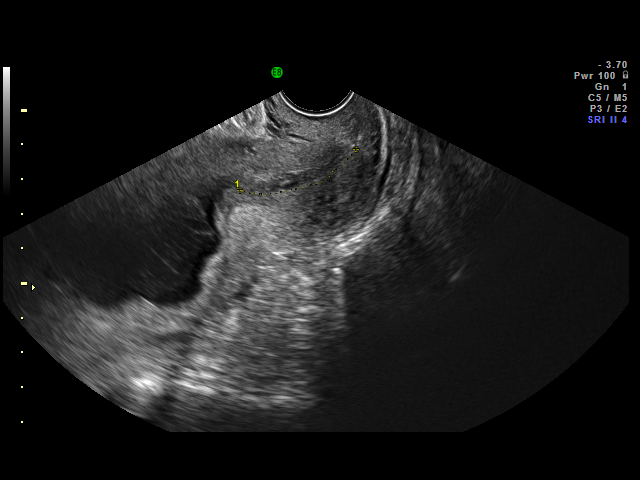
[im 20/24]
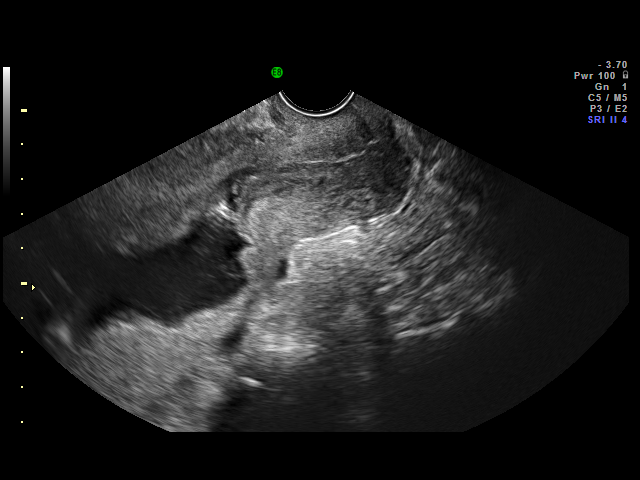
[im 21/24]
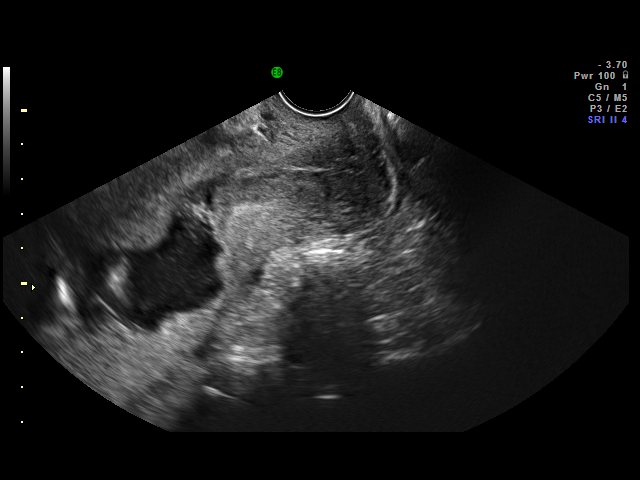
[im 23/24]
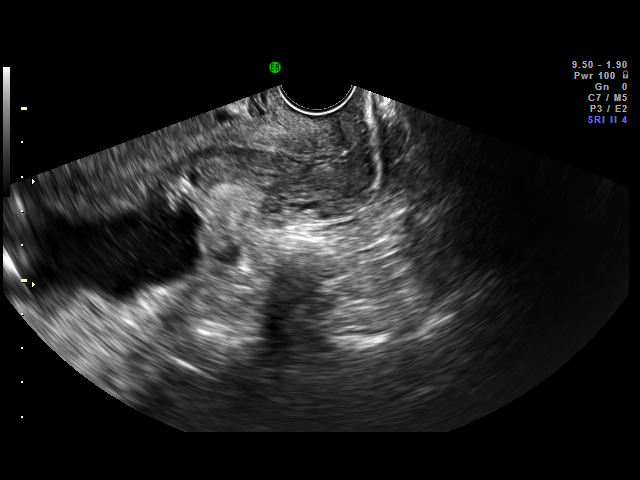
[im 24/24]
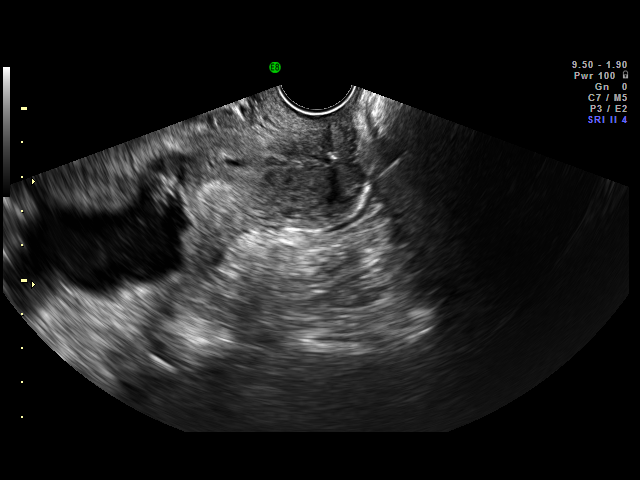

[18 of 24 positions shown; findings below may reference images not displayed]

IMPRESSION: [facility] has also been faxed to the ordering physician.

## 2011-01-18 LAB — GLUCOSE, CAPILLARY
Glucose-Capillary: 64 mg/dL — ABNORMAL LOW (ref 70–99)
Glucose-Capillary: 72 mg/dL (ref 70–99)
Glucose-Capillary: 77 mg/dL (ref 70–99)
Glucose-Capillary: 94 mg/dL (ref 70–99)
Glucose-Capillary: 98 mg/dL (ref 70–99)

## 2011-01-18 LAB — CBC
HCT: 30.3 % — ABNORMAL LOW (ref 36.0–46.0)
HCT: 33.6 % — ABNORMAL LOW (ref 36.0–46.0)
Hemoglobin: 10.1 g/dL — ABNORMAL LOW (ref 12.0–15.0)
Hemoglobin: 11.1 g/dL — ABNORMAL LOW (ref 12.0–15.0)
MCHC: 33.2 g/dL (ref 30.0–36.0)
MCHC: 33.4 g/dL (ref 30.0–36.0)
MCV: 90.5 fL (ref 78.0–100.0)
Platelets: 232 10*3/uL (ref 150–400)
RBC: 3.71 MIL/uL — ABNORMAL LOW (ref 3.87–5.11)
RDW: 15.9 % — ABNORMAL HIGH (ref 11.5–15.5)
RDW: 16.2 % — ABNORMAL HIGH (ref 11.5–15.5)

## 2011-01-18 LAB — COMPREHENSIVE METABOLIC PANEL
Albumin: 2.3 g/dL — ABNORMAL LOW (ref 3.5–5.2)
Alkaline Phosphatase: 98 U/L (ref 39–117)
BUN: 5 mg/dL — ABNORMAL LOW (ref 6–23)
BUN: 6 mg/dL (ref 6–23)
CO2: 23 mEq/L (ref 19–32)
Calcium: 8 mg/dL — ABNORMAL LOW (ref 8.4–10.5)
Calcium: 9.1 mg/dL (ref 8.4–10.5)
Creatinine, Ser: 0.64 mg/dL (ref 0.4–1.2)
GFR calc non Af Amer: >60 mL/min (ref >60)
Glucose, Bld: 108 mg/dL — ABNORMAL HIGH (ref 70–99)
Glucose, Bld: 128 mg/dL — ABNORMAL HIGH (ref 70–99)
Potassium: 3.8 mEq/L (ref 3.5–5.1)
Sodium: 137 mEq/L (ref 135–145)
Total Protein: 5.7 g/dL — ABNORMAL LOW (ref 6.0–8.3)
Total Protein: 6.4 g/dL (ref 6.0–8.3)

## 2011-01-18 LAB — POCT URINALYSIS DIP (DEVICE)
Ketones, ur: 15 mg/dL — AB
Nitrite: NEGATIVE
Specific Gravity, Urine: 1.02 (ref 1.005–1.030)
pH: 5.5 (ref 5.0–8.0)

## 2011-01-18 LAB — LACTATE DEHYDROGENASE: LDH: 153 U/L (ref 94–250)

## 2011-01-18 LAB — RPR: RPR Ser Ql: NONREACTIVE

## 2011-02-02 LAB — COMPREHENSIVE METABOLIC PANEL
ALT: 13 U/L (ref 0–35)
ALT: 13 U/L (ref 0–35)
AST: 16 U/L (ref 0–37)
AST: 17 U/L (ref 0–37)
Albumin: 2.6 g/dL — ABNORMAL LOW (ref 3.5–5.2)
Albumin: 2.6 g/dL — ABNORMAL LOW (ref 3.5–5.2)
Alkaline Phosphatase: 104 U/L (ref 39–117)
Alkaline Phosphatase: 90 U/L (ref 39–117)
CO2: 23 mEq/L (ref 19–32)
Chloride: 105 mEq/L (ref 96–112)
Chloride: 105 mEq/L (ref 96–112)
Creatinine, Ser: 0.59 mg/dL (ref 0.4–1.2)
GFR calc Af Amer: >60 mL/min (ref >60)
GFR calc Af Amer: >60 mL/min (ref >60)
Potassium: 3.6 mEq/L (ref 3.5–5.1)
Potassium: 3.7 mEq/L (ref 3.5–5.1)
Sodium: 134 mEq/L — ABNORMAL LOW (ref 135–145)
Sodium: 135 mEq/L (ref 135–145)
Total Bilirubin: 0.2 mg/dL — ABNORMAL LOW (ref 0.3–1.2)
Total Bilirubin: <0.1 mg/dL — ABNORMAL LOW (ref 0.3–1.2)

## 2011-02-02 LAB — GLUCOSE, CAPILLARY
Glucose-Capillary: 103 mg/dL — ABNORMAL HIGH (ref 70–99)
Glucose-Capillary: 105 mg/dL — ABNORMAL HIGH (ref 70–99)
Glucose-Capillary: 88 mg/dL (ref 70–99)

## 2011-02-02 LAB — CREATININE CLEARANCE, URINE, 24 HOUR
Collection Interval-CRCL: 24 hours
Creatinine Clearance: 248 mL/min — ABNORMAL HIGH (ref 75–115)
Creatinine, 24H Ur: 2107 mg/d — ABNORMAL HIGH (ref 700–1800)

## 2011-02-02 LAB — POCT URINALYSIS DIP (DEVICE)
Glucose, UA: NEGATIVE mg/dL
Glucose, UA: NEGATIVE mg/dL
Nitrite: NEGATIVE
Protein, ur: 30 mg/dL — AB
Protein, ur: 30 mg/dL — AB
Urobilinogen, UA: 0.2 mg/dL (ref 0.0–1.0)
Urobilinogen, UA: 0.2 mg/dL (ref 0.0–1.0)

## 2011-02-02 LAB — CBC
Platelets: 236 10*3/uL (ref 150–400)
Platelets: 254 10*3/uL (ref 150–400)
RBC: 3.67 MIL/uL — ABNORMAL LOW (ref 3.87–5.11)
WBC: 8.4 10*3/uL (ref 4.0–10.5)
WBC: 8.5 10*3/uL (ref 4.0–10.5)

## 2011-02-02 LAB — URINALYSIS, DIPSTICK ONLY
Glucose, UA: NEGATIVE mg/dL
Ketones, ur: 40 mg/dL — AB
Leukocytes, UA: NEGATIVE
Nitrite: NEGATIVE
Protein, ur: NEGATIVE mg/dL

## 2011-02-02 LAB — PROTEIN, URINE, 24 HOUR
Protein, Urine: 15 mg/dL
Urine Total Volume-UPROT: 1800 mL

## 2011-02-02 LAB — STREP B DNA PROBE: Strep Group B Ag: NEGATIVE

## 2011-02-03 LAB — POCT URINALYSIS DIP (DEVICE)
Bilirubin Urine: NEGATIVE
Glucose, UA: NEGATIVE mg/dL
Hgb urine dipstick: NEGATIVE
Ketones, ur: NEGATIVE mg/dL
Nitrite: NEGATIVE
Protein, ur: 100 mg/dL — AB
Protein, ur: 30 mg/dL — AB
Specific Gravity, Urine: 1.015 (ref 1.005–1.030)
Specific Gravity, Urine: 1.025 (ref 1.005–1.030)
Urobilinogen, UA: 0.2 mg/dL (ref 0.0–1.0)
Urobilinogen, UA: >=8 mg/dL (ref 0.0–1.0)
pH: 5.5 (ref 5.0–8.0)

## 2011-02-03 LAB — COMPREHENSIVE METABOLIC PANEL
CO2: 23 mEq/L (ref 19–32)
Calcium: 8.9 mg/dL (ref 8.4–10.5)
Creatinine, Ser: 0.6 mg/dL (ref 0.4–1.2)
GFR calc Af Amer: >60 mL/min (ref >60)
GFR calc non Af Amer: >60 mL/min (ref >60)
Glucose, Bld: 87 mg/dL (ref 70–99)

## 2011-02-03 LAB — URIC ACID: Uric Acid, Serum: 6.8 mg/dL (ref 2.4–7.0)

## 2011-02-03 LAB — CBC
MCHC: 33.4 g/dL (ref 30.0–36.0)
MCV: 88.6 fL (ref 78.0–100.0)
RBC: 3.62 MIL/uL — ABNORMAL LOW (ref 3.87–5.11)

## 2011-02-04 LAB — POCT URINALYSIS DIP (DEVICE)
Bilirubin Urine: NEGATIVE
Bilirubin Urine: NEGATIVE
Bilirubin Urine: NEGATIVE
Glucose, UA: NEGATIVE mg/dL
Glucose, UA: NEGATIVE mg/dL
Glucose, UA: NEGATIVE mg/dL
Hgb urine dipstick: NEGATIVE
Hgb urine dipstick: NEGATIVE
Ketones, ur: NEGATIVE mg/dL
Ketones, ur: NEGATIVE mg/dL
Ketones, ur: NEGATIVE mg/dL
Ketones, ur: NEGATIVE mg/dL
Nitrite: NEGATIVE
Protein, ur: 30 mg/dL — AB
Specific Gravity, Urine: 1.02 (ref 1.005–1.030)
Specific Gravity, Urine: >=1.03 (ref 1.005–1.030)
Specific Gravity, Urine: 1.02 (ref 1.005–1.030)
Specific Gravity, Urine: 1.025 (ref 1.005–1.030)
Urobilinogen, UA: 0.2 mg/dL (ref 0.0–1.0)
pH: 5 (ref 5.0–8.0)

## 2011-02-05 LAB — POCT URINALYSIS DIP (DEVICE)
Bilirubin Urine: NEGATIVE
Bilirubin Urine: NEGATIVE
Hgb urine dipstick: NEGATIVE
Hgb urine dipstick: NEGATIVE
Ketones, ur: NEGATIVE mg/dL
Nitrite: NEGATIVE
Nitrite: NEGATIVE
Protein, ur: 30 mg/dL — AB
Protein, ur: 30 mg/dL — AB
Specific Gravity, Urine: 1.02 (ref 1.005–1.030)
Specific Gravity, Urine: >=1.03 (ref 1.005–1.030)
Specific Gravity, Urine: 1.025 (ref 1.005–1.030)
Specific Gravity, Urine: 1.025 (ref 1.005–1.030)
Urobilinogen, UA: 0.2 mg/dL (ref 0.0–1.0)
Urobilinogen, UA: 0.2 mg/dL (ref 0.0–1.0)
pH: 5.5 (ref 5.0–8.0)
pH: 5.5 (ref 5.0–8.0)
pH: 5.5 (ref 5.0–8.0)

## 2011-02-06 LAB — POCT URINALYSIS DIP (DEVICE)
Bilirubin Urine: NEGATIVE
Hgb urine dipstick: NEGATIVE
Ketones, ur: 15 mg/dL — AB
Ketones, ur: NEGATIVE mg/dL
Nitrite: NEGATIVE
Protein, ur: 30 mg/dL — AB
Protein, ur: NEGATIVE mg/dL
Specific Gravity, Urine: 1.015 (ref 1.005–1.030)
Urobilinogen, UA: 0.2 mg/dL (ref 0.0–1.0)
pH: 5.5 (ref 5.0–8.0)
pH: 7 (ref 5.0–8.0)

## 2011-02-07 LAB — POCT URINALYSIS DIP (DEVICE)
Glucose, UA: NEGATIVE mg/dL
Glucose, UA: NEGATIVE mg/dL
Hgb urine dipstick: NEGATIVE
Hgb urine dipstick: NEGATIVE
Nitrite: NEGATIVE
Nitrite: NEGATIVE
Nitrite: NEGATIVE
Nitrite: NEGATIVE
Protein, ur: 30 mg/dL — AB
Protein, ur: 30 mg/dL — AB
Protein, ur: 30 mg/dL — AB
Protein, ur: NEGATIVE mg/dL
Specific Gravity, Urine: 1.02 (ref 1.005–1.030)
Specific Gravity, Urine: 1.025 (ref 1.005–1.030)
Urobilinogen, UA: 0.2 mg/dL (ref 0.0–1.0)
Urobilinogen, UA: 0.2 mg/dL (ref 0.0–1.0)
Urobilinogen, UA: 0.2 mg/dL (ref 0.0–1.0)
Urobilinogen, UA: 1 mg/dL (ref 0.0–1.0)
pH: 5.5 (ref 5.0–8.0)
pH: 6 (ref 5.0–8.0)

## 2011-02-07 LAB — GLUCOSE, CAPILLARY: Glucose-Capillary: 102 mg/dL — ABNORMAL HIGH (ref 70–99)

## 2011-02-08 LAB — URINALYSIS, ROUTINE W REFLEX MICROSCOPIC
Bilirubin Urine: NEGATIVE
Ketones, ur: 15 mg/dL — AB
Nitrite: NEGATIVE
Protein, ur: NEGATIVE mg/dL
pH: 6 (ref 5.0–8.0)

## 2011-02-08 LAB — WET PREP, GENITAL

## 2011-02-08 LAB — GC/CHLAMYDIA PROBE AMP, GENITAL: Chlamydia, DNA Probe: NEGATIVE

## 2011-02-09 LAB — ABO/RH: ABO/RH(D): O POS

## 2011-02-18 IMAGING — US US OB TRANSVAGINAL
1 series · 14 of 18 positions shown · non-contrast
Comparison: none

OBSTETRICAL ULTRASOUND:
 This ultrasound was performed in The [HOSPITAL], and the [facility] report will be stored to [REDACTED] PACS.

[Series 1: us ob transvaginal · 14 of 18 slices shown]
[im 1/18]
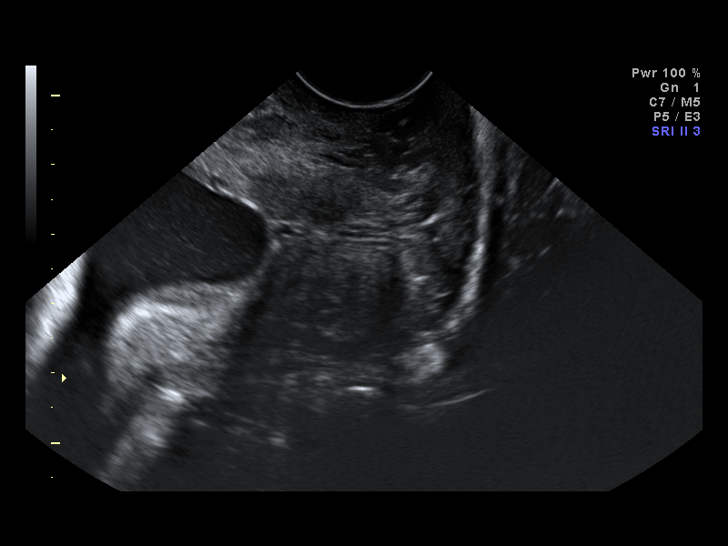
[im 2/18]
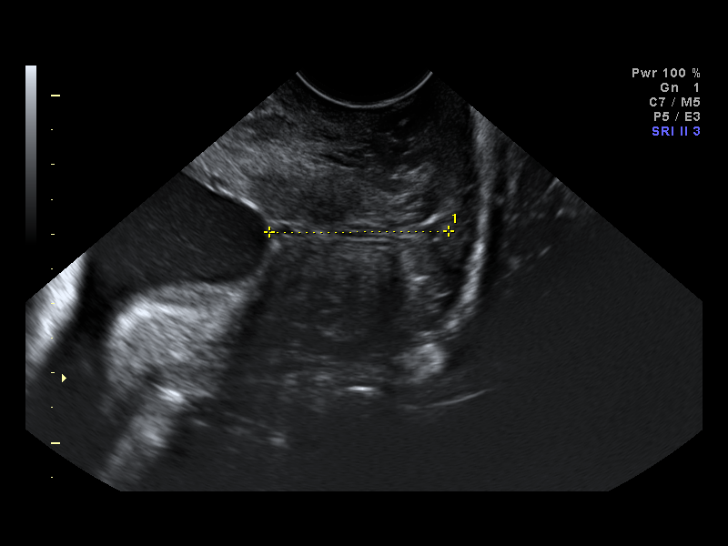
[im 4/18]
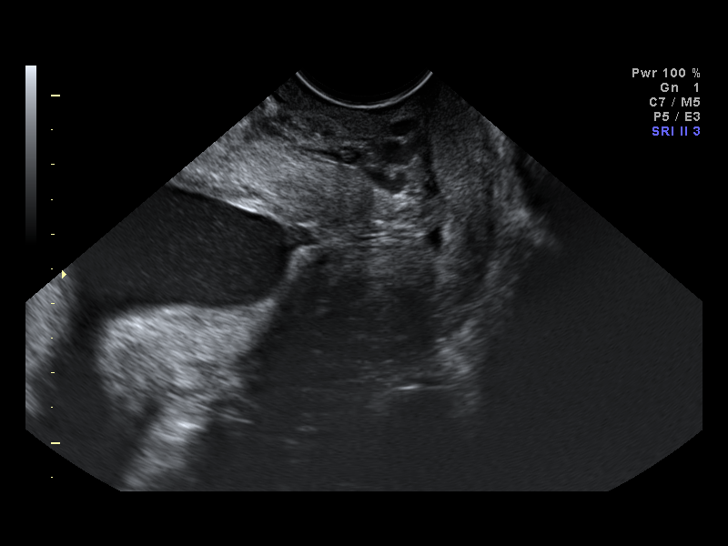
[im 5/18]
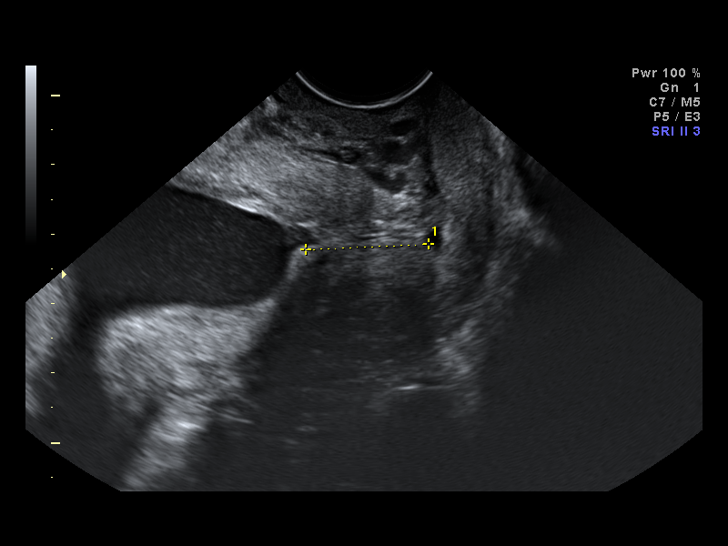
[im 6/18]
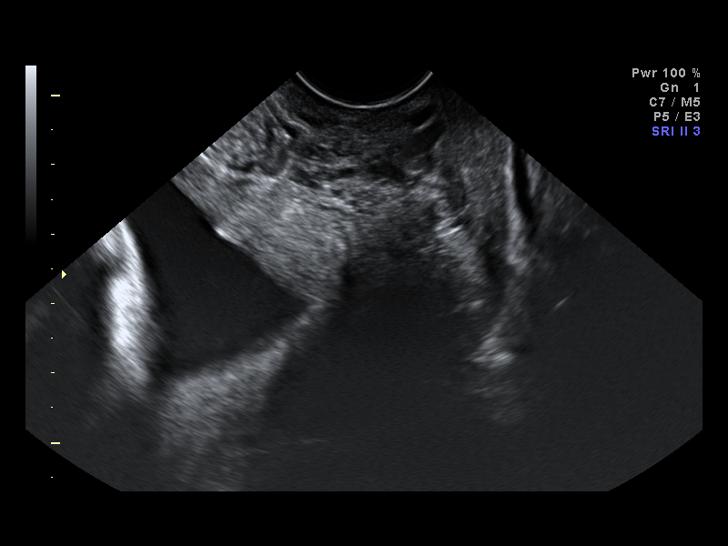
[im 8/18]
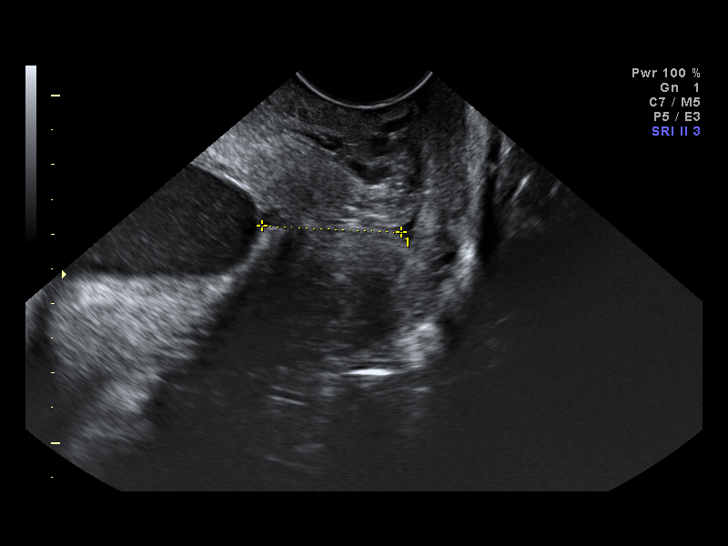
[im 9/18]
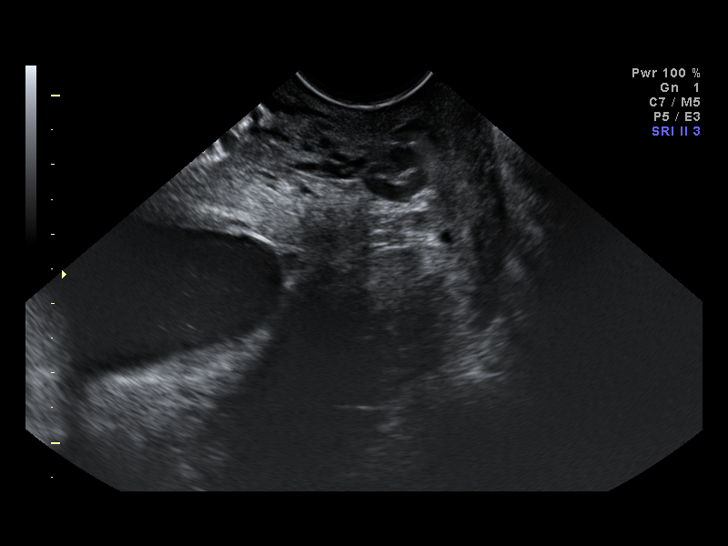
[im 10/18]
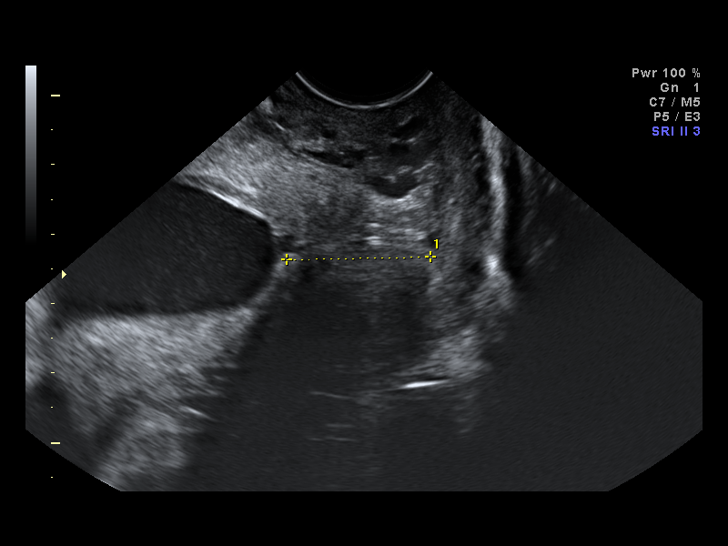
[im 11/18]
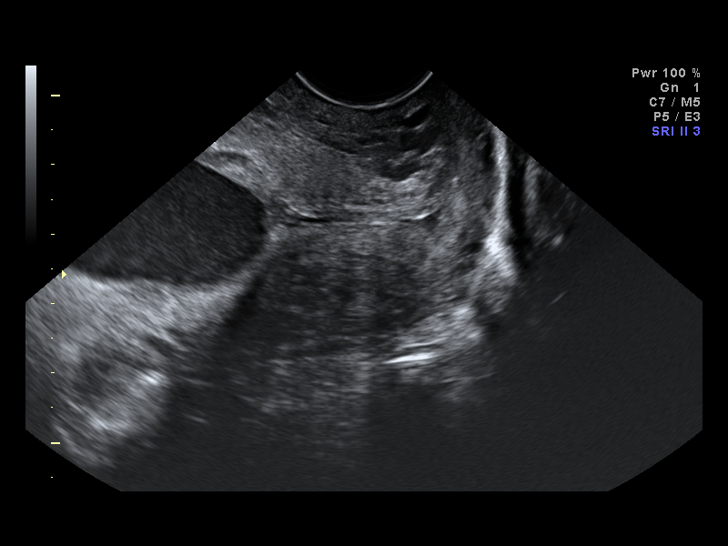
[im 13/18]
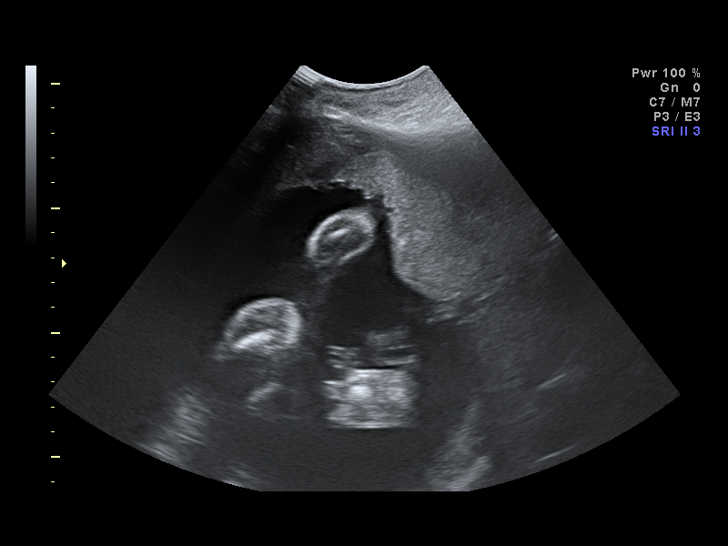
[im 14/18]
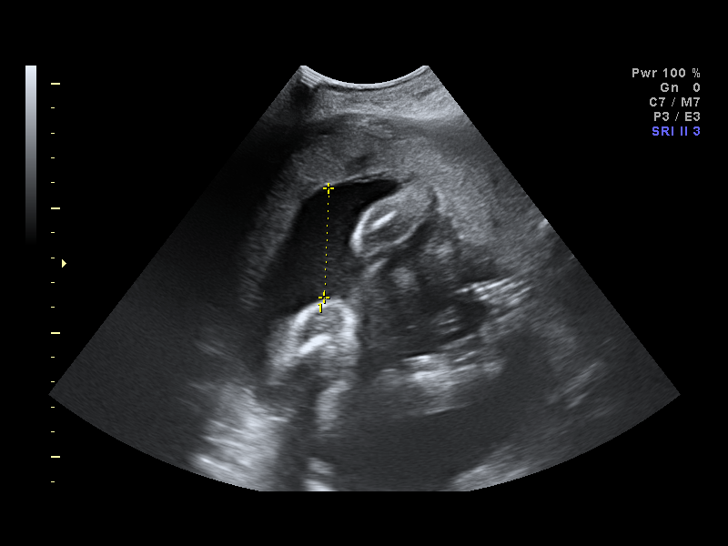
[im 15/18]
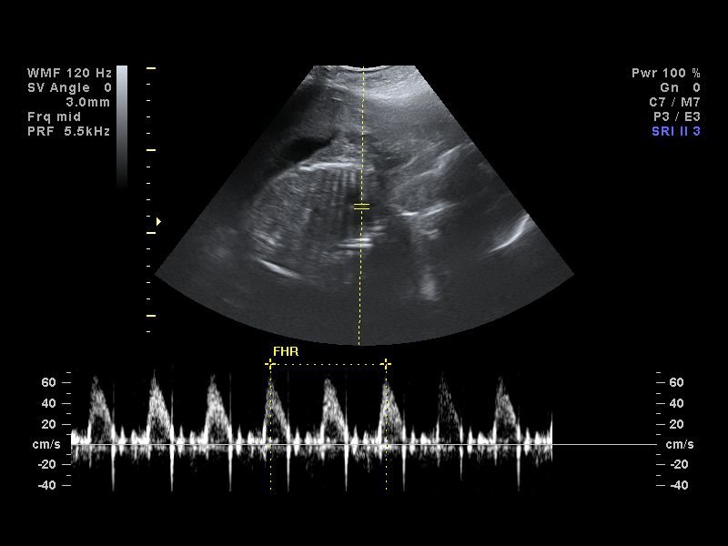
[im 17/18]
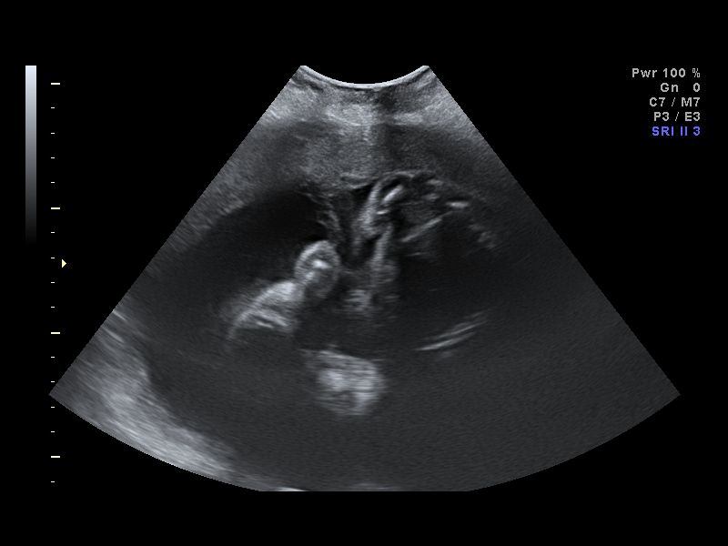
[im 18/18]
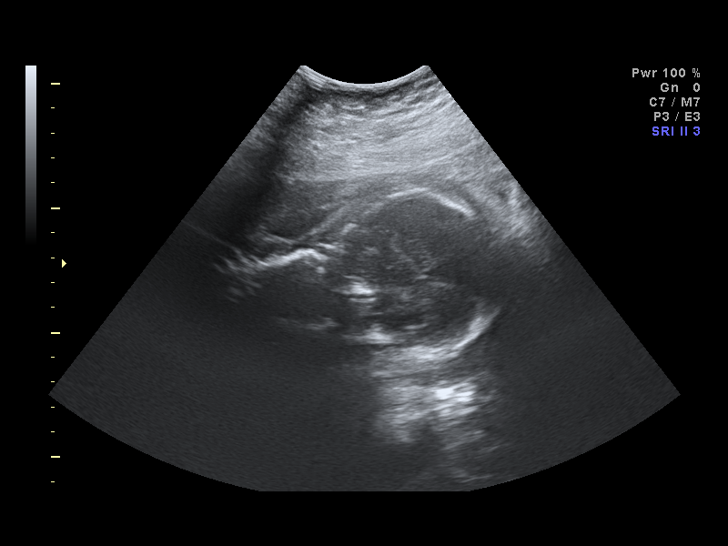

[14 of 18 positions shown; findings below may reference images not displayed]

IMPRESSION: [facility] has also been faxed to the ordering physician.

## 2011-03-17 NOTE — H&P (Signed)
Megan Barrett, Megan Barrett NO.:  [id number]   MEDICAL RECORD NO.:  [id number]          PATIENT TYPE:  [facility]   LOCATION:  [facility]                          FACILITY:  [facility]   PHYSICIAN:  Lenoard Aden, M.D.DATE OF BIRTH:  23-Jan-1971   DATE OF ADMISSION:  04/11/2009  DATE OF DISCHARGE:                              HISTORY & PHYSICAL   CHIEF COMPLAINT:  Bleeding.   She is a 40 year old African American female, G3, P1, who presents at  questionably 8-10 weeks' gestation with bleeding today.  No tissue  passage noted.  She has no known drug allergies.   MEDICATIONS:  Prenatal vitamins.   MEDICAL HISTORY:  Obesity and trigeminal neuralgia.  Pregnancy is  remarkable for uncomplicated vaginal delivery and one 17-month pregnancy  loss with no prenatal care.  She was questionably told that she needed a  cerclage at that time.   FAMILY HISTORY:  Noncontributory.   SOCIAL HISTORY:  She is a reformed smoker.   PHYSICAL EXAMINATION:  VITAL SIGNS:  Stable.  Temperature 98, blood  pressure 151/72, pulse of 72, and respirations 20.  HEENT:  Normal.  LUNGS:  Clear.  HEART:  Regular rate and rhythm.  ABDOMEN:  Soft and nontender.  PELVIC:  Deferred.  EXTREMITIES:  No cords.  NEUROLOGIC:  Nonfocal.  SKIN:  Intact.   Ultrasound reveals an 8-week viable intrauterine pregnancy without  evidence of subchorionic hemorrhage.  Blood group and Rh are pending.   IMPRESSION:  1. Eight week intrauterine pregnancy with threatened abortion, viable      intrauterine pregnancy noted.  2. History of unexplained second trimester pregnancy loss.   PLAN:  Pelvic rest discussed.  Follow up in the office in 1 week.  Bleeding precautions given.      Lenoard Aden, M.D.  Electronically Signed     RJT/MEDQ  D:  04/11/2009  T:  04/11/2009  Job:  [id number]

## 2011-05-09 IMAGING — US US FETAL BPP W/O NONSTRESS
1 series · 14 of 21 positions shown · non-contrast
Comparison: none

OBSTETRICAL ULTRASOUND:
 This ultrasound exam was performed in the [HOSPITAL] Ultrasound Department.  The OB US report was generated in the AS system, and faxed to the ordering physician.  This report is also available in [HOSPITAL]?s [employer] and in [REDACTED] PACS.

[Series 1: us fetal bpp w/o nonstress · non-contrast · 0.41mm/px · 21 acquisitions, 14 frames shown]
[im 1/21]
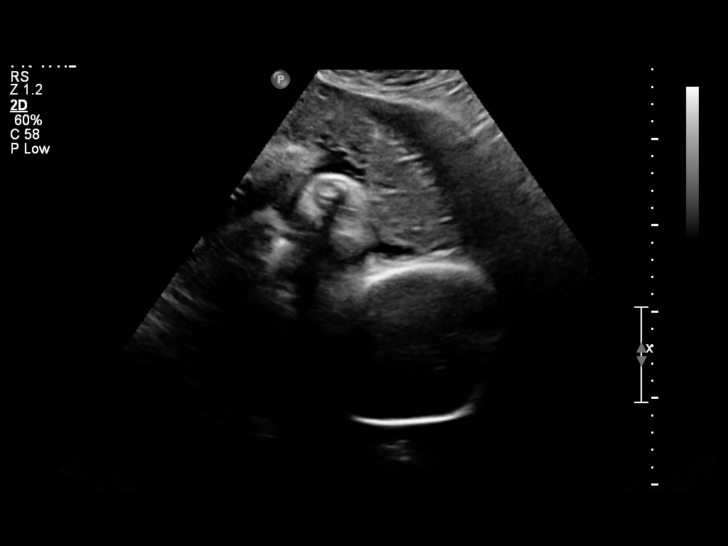
[im 3/21]
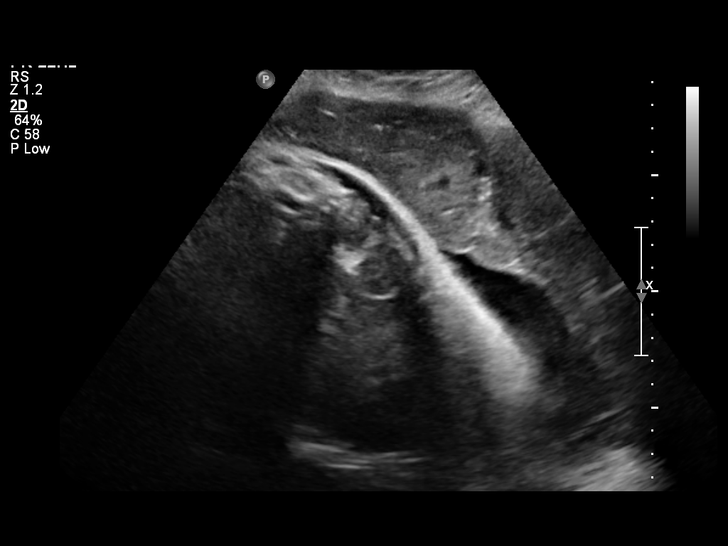
[im 4/21]
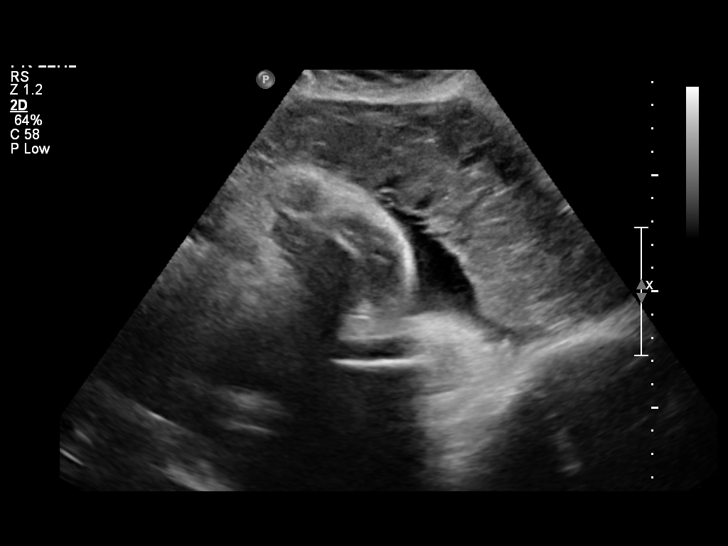
[im 6/21]
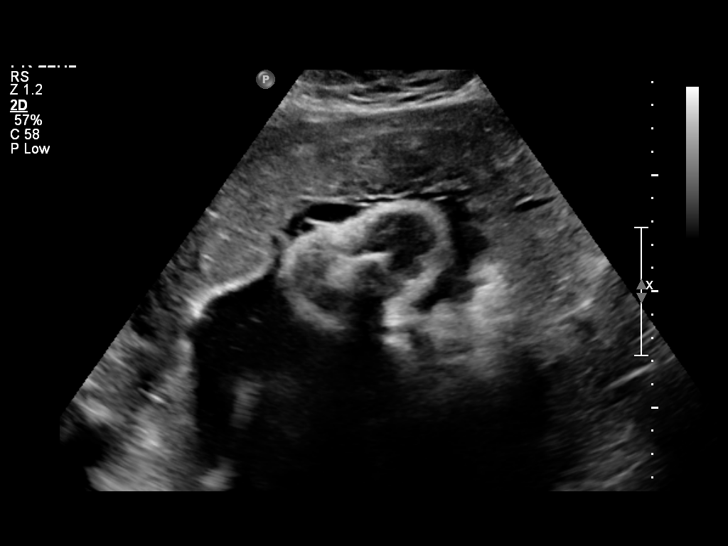
[im 7/21]
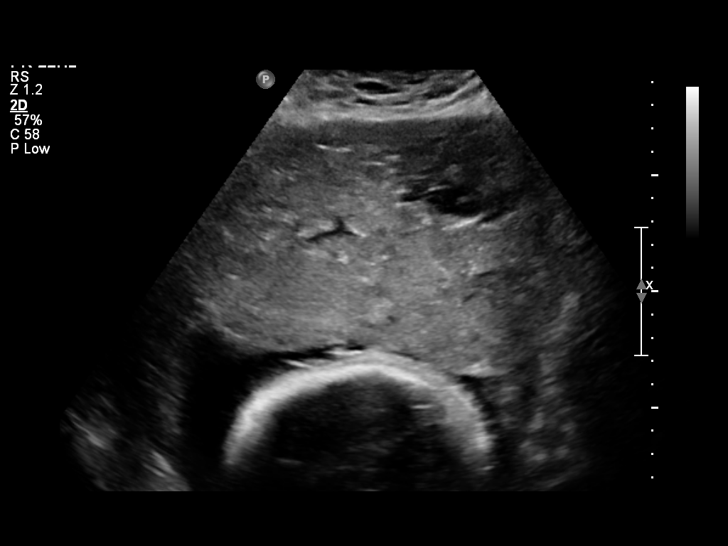
[im 9/21]
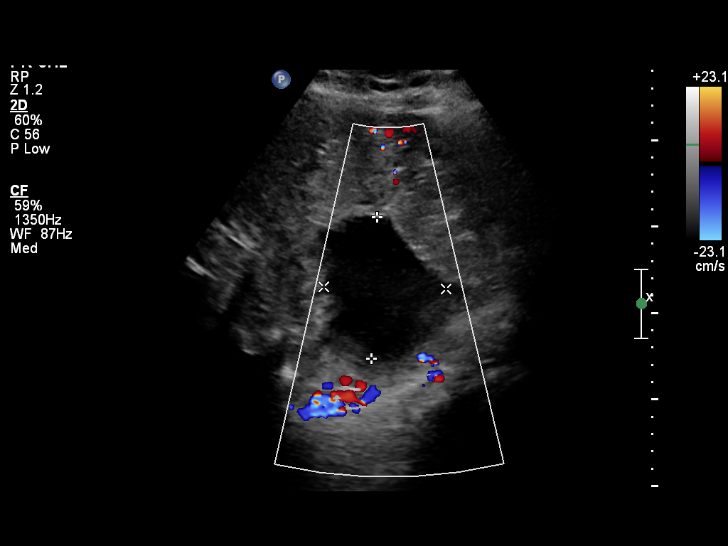
[im 10/21]
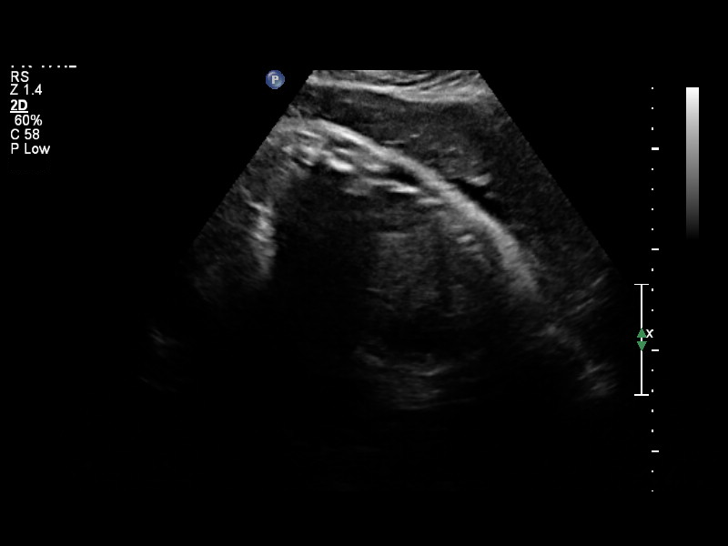
[im 12/21]
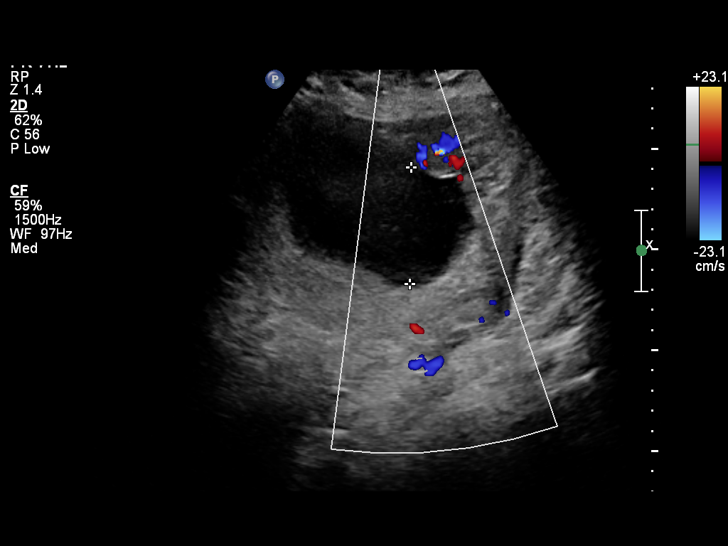
[im 13/21]
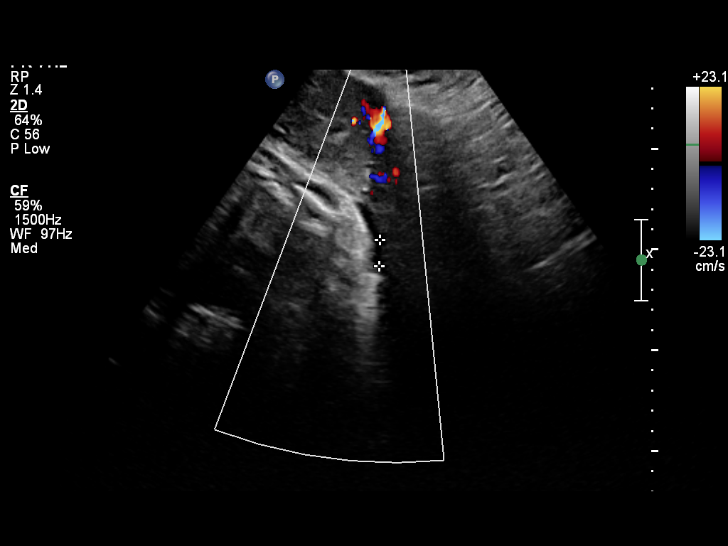
[im 15/21]
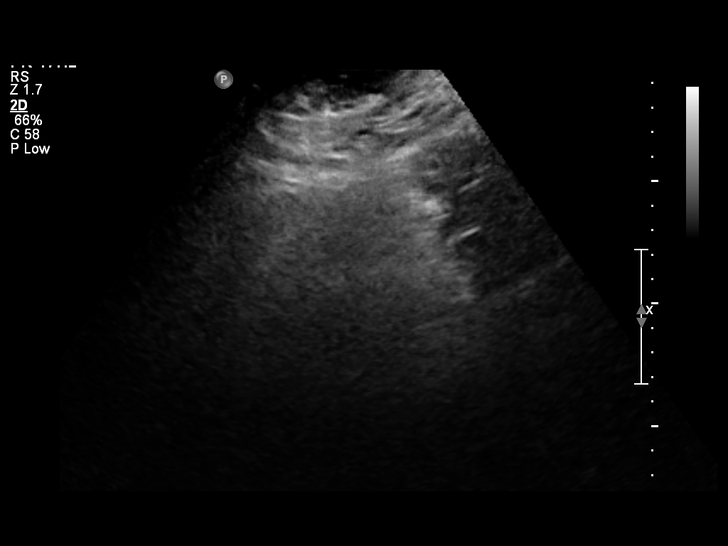
[im 16/21]
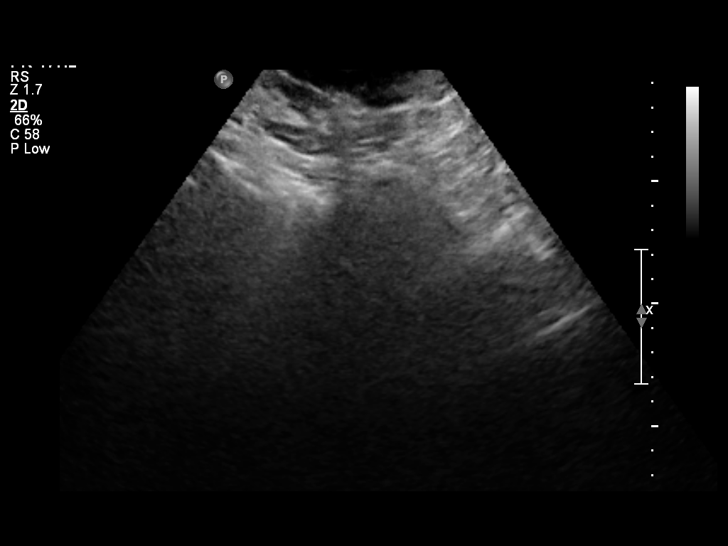
[im 18/21]
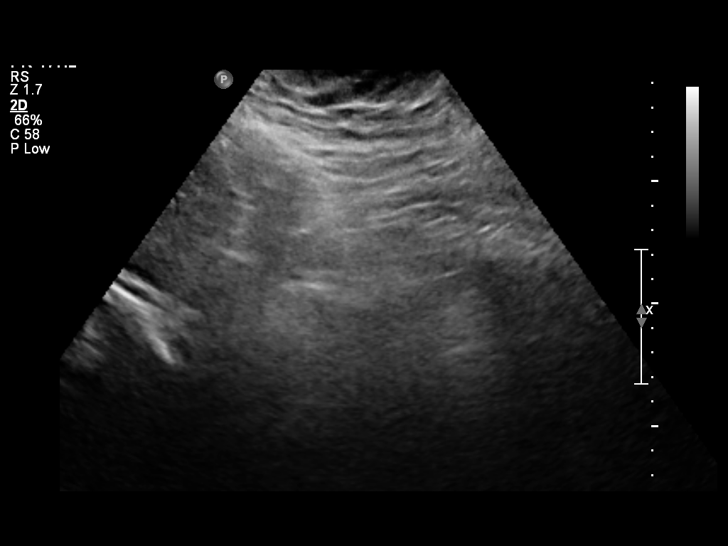
[im 19/21]
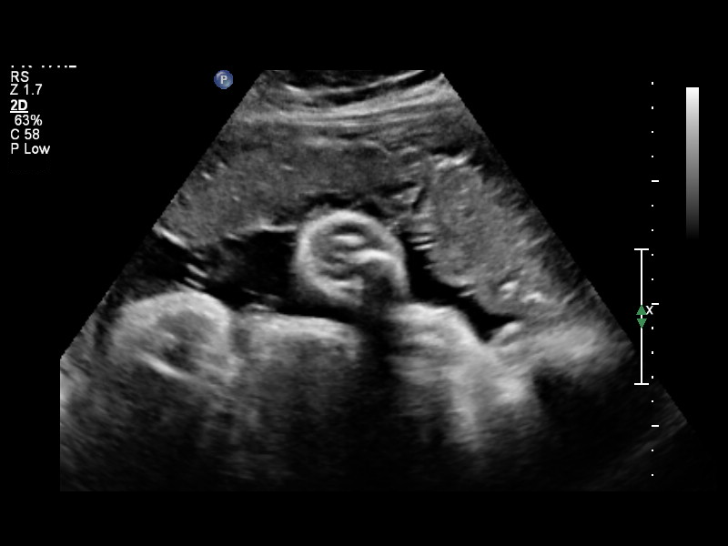
[im 21/21]
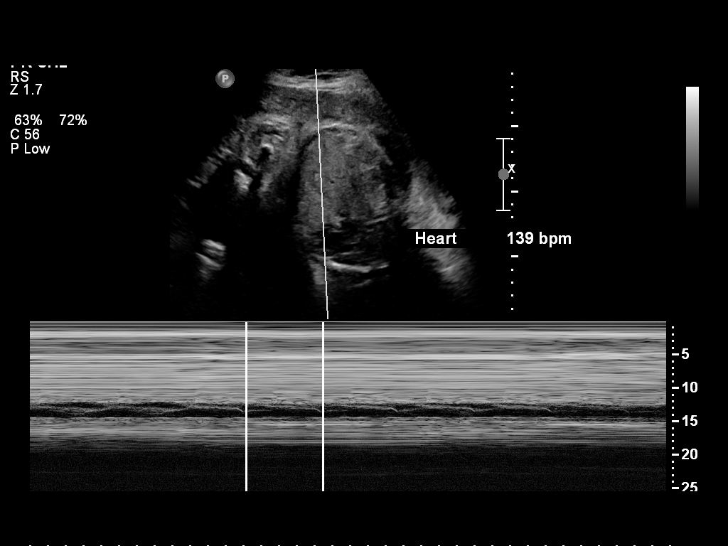

[14 of 21 positions shown; findings below may reference images not displayed]

IMPRESSION: See AS Obstetric US report.

## 2011-09-23 ENCOUNTER — Encounter: Payer: Self-pay | Admitting: Emergency Medicine

## 2011-09-23 ENCOUNTER — Emergency Department (HOSPITAL_COMMUNITY)
Admission: EM | Admit: 2011-09-23 | Discharge: 2011-09-23 | Discharge status: Against Medical Advice | Payer: Self-pay | Attending: Emergency Medicine | Admitting: Emergency Medicine

## 2011-09-23 DIAGNOSIS — Z0389 Encounter for observation for other suspected diseases and conditions ruled out: Secondary | ICD-10-CM | POA: Insufficient documentation

## 2011-09-23 HISTORY — DX: Essential (primary) hypertension: I10

## 2011-09-23 HISTORY — DX: Trigeminal neuralgia: G50.0

## 2011-09-23 NOTE — ED Notes (Signed)
Pt c/o being hypertensive.  St[facility] has not taken B/P meds in approx 1 month.     [facility] earlier today she had blurry vision.

## 2013-01-27 ENCOUNTER — Emergency Department (HOSPITAL_BASED_OUTPATIENT_CLINIC_OR_DEPARTMENT_OTHER)
Admission: EM | Admit: 2013-01-27 | Discharge: 2013-01-28 | Disposition: A | Discharge status: Home / Self Care | Payer: BC Managed Care – PPO | Attending: Emergency Medicine | Admitting: Emergency Medicine

## 2013-01-27 ENCOUNTER — Encounter (HOSPITAL_BASED_OUTPATIENT_CLINIC_OR_DEPARTMENT_OTHER): Payer: Self-pay | Admitting: *Deleted

## 2013-01-27 ENCOUNTER — Emergency Department (HOSPITAL_BASED_OUTPATIENT_CLINIC_OR_DEPARTMENT_OTHER): Payer: BC Managed Care – PPO

## 2013-01-27 DIAGNOSIS — N390 Urinary tract infection, site not specified: Secondary | ICD-10-CM | POA: Insufficient documentation

## 2013-01-27 DIAGNOSIS — R3589 Other polyuria: Secondary | ICD-10-CM | POA: Insufficient documentation

## 2013-01-27 DIAGNOSIS — R0789 Other chest pain: Secondary | ICD-10-CM | POA: Insufficient documentation

## 2013-01-27 DIAGNOSIS — J45901 Unspecified asthma with (acute) exacerbation: Secondary | ICD-10-CM | POA: Insufficient documentation

## 2013-01-27 DIAGNOSIS — F17200 Nicotine dependence, unspecified, uncomplicated: Secondary | ICD-10-CM | POA: Insufficient documentation

## 2013-01-27 DIAGNOSIS — R059 Cough, unspecified: Secondary | ICD-10-CM | POA: Insufficient documentation

## 2013-01-27 DIAGNOSIS — E119 Type 2 diabetes mellitus without complications: Secondary | ICD-10-CM | POA: Insufficient documentation

## 2013-01-27 DIAGNOSIS — R6883 Chills (without fever): Secondary | ICD-10-CM | POA: Insufficient documentation

## 2013-01-27 DIAGNOSIS — R358 Other polyuria: Secondary | ICD-10-CM | POA: Insufficient documentation

## 2013-01-27 DIAGNOSIS — R630 Anorexia: Secondary | ICD-10-CM | POA: Insufficient documentation

## 2013-01-27 DIAGNOSIS — R209 Unspecified disturbances of skin sensation: Secondary | ICD-10-CM | POA: Insufficient documentation

## 2013-01-27 DIAGNOSIS — I10 Essential (primary) hypertension: Secondary | ICD-10-CM | POA: Insufficient documentation

## 2013-01-27 DIAGNOSIS — J45909 Unspecified asthma, uncomplicated: Secondary | ICD-10-CM

## 2013-01-27 DIAGNOSIS — R05 Cough: Secondary | ICD-10-CM | POA: Insufficient documentation

## 2013-01-27 DIAGNOSIS — Z3202 Encounter for pregnancy test, result negative: Secondary | ICD-10-CM | POA: Insufficient documentation

## 2013-01-27 DIAGNOSIS — Z8632 Personal history of gestational diabetes: Secondary | ICD-10-CM | POA: Insufficient documentation

## 2013-01-27 DIAGNOSIS — Z8669 Personal history of other diseases of the nervous system and sense organs: Secondary | ICD-10-CM | POA: Insufficient documentation

## 2013-01-27 DIAGNOSIS — Z79899 Other long term (current) drug therapy: Secondary | ICD-10-CM | POA: Insufficient documentation

## 2013-01-27 LAB — URINALYSIS, ROUTINE W REFLEX MICROSCOPIC
Bilirubin Urine: NEGATIVE
Ketones, ur: NEGATIVE mg/dL
Nitrite: NEGATIVE
Specific Gravity, Urine: 1.012 (ref 1.005–1.030)
Urobilinogen, UA: 1 mg/dL (ref 0.0–1.0)

## 2013-01-27 LAB — CBC WITH DIFFERENTIAL/PLATELET
Eosinophils Relative: 1 % (ref 0–5)
HCT: 37.6 % (ref 36.0–46.0)
Lymphocytes Relative: 22 % (ref 12–46)
Lymphs Abs: 2.4 10*3/uL (ref 0.7–4.0)
MCV: 83.4 fL (ref 78.0–100.0)
Monocytes Absolute: 0.9 10*3/uL (ref 0.1–1.0)
Platelets: 264 10*3/uL (ref 150–400)
RBC: 4.51 MIL/uL (ref 3.87–5.11)
WBC: 11.1 10*3/uL — ABNORMAL HIGH (ref 4.0–10.5)

## 2013-01-27 LAB — URINE MICROSCOPIC-ADD ON

## 2013-01-27 MED ORDER — ALBUTEROL SULFATE (5 MG/ML) 0.5% IN NEBU
5.0000 mg | INHALATION_SOLUTION | Freq: Once | RESPIRATORY_TRACT | Status: AC
  Administered 2013-01-27: 5 mg via RESPIRATORY_TRACT
  Filled 2013-01-27: qty 1

## 2013-01-27 MED ORDER — IPRATROPIUM BROMIDE 0.02 % IN SOLN
0.5000 mg | Freq: Once | RESPIRATORY_TRACT | Status: AC
  Administered 2013-01-27: 0.5 mg via RESPIRATORY_TRACT
  Filled 2013-01-27: qty 2.5

## 2013-01-27 MED ORDER — SODIUM CHLORIDE 0.9 % IV BOLUS (SEPSIS): 1000.0000 mL | Freq: Once | INTRAVENOUS | Status: DC

## 2013-01-27 NOTE — ED Provider Notes (Addendum)
History     CSN: [id number]  Arrival date & time 01/27/13  2250   First MD Initiated Contact with Patient 01/27/13 2310      Chief Complaint  Patient presents with  . Weakness    (Consider location/radiation/quality/duration/timing/severity/associated sxs/prior treatment) HPI This is a 42 year old female with a history of gestational diabetes. She recently had a 3-day steroid infusion for an unspecified problem with her left eye. For the past 2 days she has felt generally weak, had chills, polyuria and dry mouth with loss of appetite. The symptoms are moderate to severe without mitigating factor. She states she's also had increased cough today with shortness of breath and chest tightness as well as chest soreness. Chest tightness came on after the cough began. She is also having paresthesias in the right upper extremity. She has used her inhaler only one time today and states it has not relieved her chest symptoms.  Past Medical History  Diagnosis Date  . Hypertension   . Asthma   . Diabetes mellitus   . Trigeminal neuralgia     Past Surgical History  Procedure Laterality Date  . Laparoscopic tubal ligation      No family history on file.  History  Substance Use Topics  . Smoking status: Current Every Day Smoker    Types: Cigarettes  . Smokeless tobacco: Not on file  . Alcohol Use: No    OB History   Grav Para Term Preterm Abortions TAB SAB Ect Mult Living                  Review of Systems  All other systems reviewed and are negative.    Allergies  Review of patient's allergies indicates no known allergies.  Home Medications   Current Outpatient Rx  Name  Route  Sig  Dispense  Refill  . albuterol (PROVENTIL HFA;VENTOLIN HFA) 108 (90 BASE) MCG/ACT inhaler   Inhalation   Inhale 2 puffs into the lungs daily as needed.           . furosemide (LASIX) 40 MG tablet   Oral   Take 40 mg by mouth daily.           . Oxcarbazepine (TRILEPTAL) 300 MG  tablet   Oral   Take 900 mg by mouth 3 (three) times daily.             BP 168/97  Pulse 108  Temp(Src) 99.2 F (37.3 C) (Oral)  Resp 22  Wt 325 lb (147.419 kg)  BMI 61.44 kg/m2  SpO2 97%  LMP 01/09/2013  Physical Exam General: Well-developed, obese female in no acute distress; appearance consistent with age of record HENT: normocephalic, atraumatic; mucous membranes dry Eyes: pupils equal round and reactive to light; extraocular muscles intact Neck: supple Heart: regular rate and rhythm; tachycardic Lungs: clear to auscultation bilaterally; frequent dry cough Chest: Bilateral parasternal tenderness  Abdomen: soft; obese ; nontender; bowel sounds present Extremities: No deformity; full range of motion;  trace edema of ankles  Neurologic: Awake, alert and oriented; motor function intact in all extremities and symmetric; no facial droop Skin: Warm and dry Psychiatric: Normal mood and affect    ED Course  Procedures (including critical care time)     MDM   Nursing notes and vitals signs, including pulse oximetry, reviewed.  Summary of this visit's results, reviewed by myself:  Labs:  Results for orders placed during the hospital encounter of 01/27/13 (from the past 24 hour(s))  URINALYSIS, ROUTINE W  REFLEX MICROSCOPIC     Status: Abnormal   Collection Time    01/27/13 10:56 PM      Result Value Range   Color, Urine YELLOW  YELLOW   APPearance CLOUDY (*) CLEAR   Specific Gravity, Urine 1.012  1.005 - 1.030   pH 7.0  5.0 - 8.0   Glucose, UA NEGATIVE  NEGATIVE mg/dL   Hgb urine dipstick TRACE (*) NEGATIVE   Bilirubin Urine NEGATIVE  NEGATIVE   Ketones, ur NEGATIVE  NEGATIVE mg/dL   Protein, ur NEGATIVE  NEGATIVE mg/dL   Urobilinogen, UA 1.0  0.0 - 1.0 mg/dL   Nitrite NEGATIVE  NEGATIVE   Leukocytes, UA LARGE (*) NEGATIVE  PREGNANCY, URINE     Status: None   Collection Time    01/27/13 10:56 PM      Result Value Range   Preg Test, Ur NEGATIVE  NEGATIVE   URINE MICROSCOPIC-ADD ON     Status: Abnormal   Collection Time    01/27/13 10:56 PM      Result Value Range   Squamous Epithelial / LPF MANY (*) RARE   WBC, UA TOO NUMEROUS TO COUNT  <3 WBC/hpf   RBC / HPF 0-2  <3 RBC/hpf   Bacteria, UA MANY (*) RARE   Urine-Other FEW YEAST    GLUCOSE, CAPILLARY     Status: Abnormal   Collection Time    01/27/13 11:28 PM      Result Value Range   Glucose-Capillary 138 (*) 70 - 99 mg/dL  CBC WITH DIFFERENTIAL     Status: Abnormal   Collection Time    01/27/13 11:42 PM      Result Value Range   WBC 11.1 (*) 4.0 - 10.5 K/uL   RBC 4.51  3.87 - 5.11 MIL/uL   Hemoglobin 12.3  12.0 - 15.0 g/dL   HCT 40.9  81.1 - 91.4 %   MCV 83.4  78.0 - 100.0 fL   MCH 27.3  26.0 - 34.0 pg   MCHC 32.7  30.0 - 36.0 g/dL   RDW 78.2  95.6 - 21.3 %   Platelets 264  150 - 400 K/uL   Neutrophils Relative 69  43 - 77 %   Neutro Abs 7.6  1.7 - 7.7 K/uL   Lymphocytes Relative 22  12 - 46 %   Lymphs Abs 2.4  0.7 - 4.0 K/uL   Monocytes Relative 8  3 - 12 %   Monocytes Absolute 0.9  0.1 - 1.0 K/uL   Eosinophils Relative 1  0 - 5 %   Eosinophils Absolute 0.2  0.0 - 0.7 K/uL   Basophils Relative 0  0 - 1 %   Basophils Absolute 0.0  0.0 - 0.1 K/uL  BASIC METABOLIC PANEL     Status: Abnormal   Collection Time    01/27/13 11:42 PM      Result Value Range   Sodium 135  135 - 145 mEq/L   Potassium 3.4 (*) 3.5 - 5.1 mEq/L   Chloride 96  96 - 112 mEq/L   CO2 27  19 - 32 mEq/L   Glucose, Bld 140 (*) 70 - 99 mg/dL   BUN 9  6 - 23 mg/dL   Creatinine, Ser 0.86  0.50 - 1.10 mg/dL   Calcium 8.7  8.4 - 57.8 mg/dL   GFR calc non Af Amer >90  >90 mL/min   GFR calc Af Amer >90  >90 mL/min    Imaging Studies: Dg  Chest 2 View  01/28/2013  *RADIOLOGY REPORT*  Clinical Data: Cough, chest pain, shortness of breath for 1 day, history hypertension, asthma, diabetes, smoking  CHEST - 2 VIEW  Comparison: 07/04/2009  Findings: Normal heart size and pulmonary vascularity. Tortuous aorta.  Minimal scarring at lingula and right base. Lungs otherwise clear. No pleural effusion or pneumothorax. Endplate spur formation thoracic spine.  IMPRESSION: Scarring lingula and right base. Otherwise negative exam.   Original Report Authenticated By: Ulyses Southward, M.D.       EKG Interpretation:  Date & Time: 01/27/2013 11:13 PM  Rate: 103  Rhythm: sinus tachycardia  QRS Axis: normal  Intervals: normal  ST/T Wave abnormalities: normal  Conduction Disutrbances:none  Narrative Interpretation:   Old EKG Reviewed: none available  12:22 AM Patient feels better after albuterol and Atrovent. She states her chest tightness has resolved and she can breathe more easily and is now able to cough up phlegm. She was advised of laboratories findings showing a urinary tract infection. She was also advised that her sugar is slightly elevated which may be a reaction to recent steroid treatment. If her symptoms persist she should followup with her primary care physician.        Hanley Seamen, MD 01/28/13 0023  Hanley Seamen, MD 01/28/13 [phone]

## 2013-01-27 NOTE — ED Notes (Addendum)
Pt c/o cough, chills and right arm numbness since yesterday. Pt sts she is also urinating frequently. Pt sts she began having mid sternal chest tightness after the cough began.

## 2013-01-27 NOTE — ED Notes (Signed)
MD at bedside. 

## 2013-01-28 LAB — BASIC METABOLIC PANEL
CO2: 27 mEq/L (ref 19–32)
Calcium: 8.7 mg/dL (ref 8.4–10.5)
Chloride: 96 mEq/L (ref 96–112)
Glucose, Bld: 140 mg/dL — ABNORMAL HIGH (ref 70–99)
Sodium: 135 mEq/L (ref 135–145)

## 2013-01-28 MED ORDER — FLUCONAZOLE 50 MG PO TABS
150.0000 mg | ORAL_TABLET | Freq: Once | ORAL | Status: AC
  Administered 2013-01-28: 150 mg via ORAL
  Filled 2013-01-28: qty 1

## 2013-01-28 MED ORDER — NITROFURANTOIN MONOHYD MACRO 100 MG PO CAPS
100.0000 mg | ORAL_CAPSULE | Freq: Once | ORAL | Status: AC
  Administered 2013-01-28: 100 mg via ORAL
  Filled 2013-01-28: qty 1

## 2013-01-28 MED ORDER — NITROFURANTOIN MONOHYD MACRO 100 MG PO CAPS: 100.0000 mg | ORAL_CAPSULE | Freq: Two times a day (BID) | ORAL | Status: DC

## 2013-01-28 NOTE — ED Notes (Signed)
No IV access available after several attempts. MD order PO challenge. Pt tolerating PO liquids at this time.

## 2013-01-29 LAB — URINE CULTURE: Colony Count: >=100000

## 2013-04-19 ENCOUNTER — Encounter (HOSPITAL_BASED_OUTPATIENT_CLINIC_OR_DEPARTMENT_OTHER): Payer: Self-pay

## 2013-04-19 ENCOUNTER — Other Ambulatory Visit: Payer: Self-pay

## 2013-04-19 ENCOUNTER — Emergency Department (HOSPITAL_BASED_OUTPATIENT_CLINIC_OR_DEPARTMENT_OTHER): Payer: BC Managed Care – PPO

## 2013-04-19 ENCOUNTER — Emergency Department (HOSPITAL_BASED_OUTPATIENT_CLINIC_OR_DEPARTMENT_OTHER)
Admission: EM | Admit: 2013-04-19 | Discharge: 2013-04-19 | Disposition: A | Discharge status: Home / Self Care | Payer: BC Managed Care – PPO | Attending: Emergency Medicine | Admitting: Emergency Medicine

## 2013-04-19 DIAGNOSIS — Z3202 Encounter for pregnancy test, result negative: Secondary | ICD-10-CM | POA: Insufficient documentation

## 2013-04-19 DIAGNOSIS — J329 Chronic sinusitis, unspecified: Secondary | ICD-10-CM | POA: Insufficient documentation

## 2013-04-19 DIAGNOSIS — F17200 Nicotine dependence, unspecified, uncomplicated: Secondary | ICD-10-CM | POA: Insufficient documentation

## 2013-04-19 DIAGNOSIS — J029 Acute pharyngitis, unspecified: Secondary | ICD-10-CM | POA: Insufficient documentation

## 2013-04-19 DIAGNOSIS — Z79899 Other long term (current) drug therapy: Secondary | ICD-10-CM | POA: Insufficient documentation

## 2013-04-19 DIAGNOSIS — Z8669 Personal history of other diseases of the nervous system and sense organs: Secondary | ICD-10-CM | POA: Insufficient documentation

## 2013-04-19 DIAGNOSIS — J45909 Unspecified asthma, uncomplicated: Secondary | ICD-10-CM | POA: Insufficient documentation

## 2013-04-19 DIAGNOSIS — E119 Type 2 diabetes mellitus without complications: Secondary | ICD-10-CM | POA: Insufficient documentation

## 2013-04-19 DIAGNOSIS — I10 Essential (primary) hypertension: Secondary | ICD-10-CM | POA: Insufficient documentation

## 2013-04-19 LAB — BASIC METABOLIC PANEL
BUN: 7 mg/dL (ref 6–23)
CO2: 28 mEq/L (ref 19–32)
Calcium: 9.6 mg/dL (ref 8.4–10.5)
Creatinine, Ser: 0.7 mg/dL (ref 0.50–1.10)
GFR calc non Af Amer: >90 mL/min (ref >90)
Glucose, Bld: 121 mg/dL — ABNORMAL HIGH (ref 70–99)

## 2013-04-19 LAB — CBC WITH DIFFERENTIAL/PLATELET
Eosinophils Absolute: 0.2 10*3/uL (ref 0.0–0.7)
Eosinophils Relative: 3 % (ref 0–5)
HCT: 38.2 % (ref 36.0–46.0)
Lymphs Abs: 2 10*3/uL (ref 0.7–4.0)
MCH: 26.3 pg (ref 26.0–34.0)
MCV: 81.1 fL (ref 78.0–100.0)
Monocytes Absolute: 0.8 10*3/uL (ref 0.1–1.0)
Monocytes Relative: 13 % — ABNORMAL HIGH (ref 3–12)
Platelets: 307 10*3/uL (ref 150–400)
RBC: 4.71 MIL/uL (ref 3.87–5.11)

## 2013-04-19 LAB — URINALYSIS, ROUTINE W REFLEX MICROSCOPIC
Glucose, UA: NEGATIVE mg/dL
Hgb urine dipstick: NEGATIVE
Ketones, ur: NEGATIVE mg/dL
Protein, ur: NEGATIVE mg/dL
pH: 7 (ref 5.0–8.0)

## 2013-04-19 MED ORDER — CHLORTHALIDONE 25 MG PO TABS: 25.0000 mg | ORAL_TABLET | Freq: Every day | ORAL | Status: DC

## 2013-04-19 MED ORDER — AMOXICILLIN 500 MG PO CAPS: 500.0000 mg | ORAL_CAPSULE | Freq: Three times a day (TID) | ORAL | Status: DC

## 2013-04-19 NOTE — ED Notes (Signed)
Pt states that she has been having sinus pain pressure, sore throat, dry throat, eye lid swelling.  Pt states that she has been taking multiple allergy and sinus medications with no relief.  Sometimes c/o of difficulty swallowing.

## 2013-04-19 NOTE — ED Provider Notes (Signed)
Medical screening examination/treatment/procedure(s) were performed by non-physician practitioner and as supervising physician I was immediately available for consultation/collaboration.    Christopher J. Pollina, MD 04/19/13 2304 

## 2013-04-19 NOTE — ED Provider Notes (Signed)
History     CSN: [id number]  Arrival date & time 04/19/13  4098   First MD Initiated Contact with Patient 04/19/13 1902      Chief Complaint  Patient presents with  . Facial Pain    (Consider location/radiation/quality/duration/timing/severity/associated sxs/prior treatment) HPI Comments: Pt states that over the last month she has had swelling to her to her eye lids that goes down with benadryl and cold medication, but over the last couple of day she has had facial pressure and sore throat:pt states that she has not been taking her bp medication for the last couple of months because she hasn't been able to see a doctor because of lack of insurance:pt states that she "passed out at work" today which she has done in the past when her bp gets to high:pt denies cp or sob:pt states that the last time this happened she was admitted and everything was fine:pt denies neck pain or any other pain from the fall  The history is provided by the patient. No language interpreter was used.    Past Medical History  Diagnosis Date  . Hypertension   . Asthma   . Diabetes mellitus   . Trigeminal neuralgia     Past Surgical History  Procedure Laterality Date  . Laparoscopic tubal ligation      History reviewed. No pertinent family history.  History  Substance Use Topics  . Smoking status: Current Every Day Smoker    Types: Cigarettes  . Smokeless tobacco: Not on file  . Alcohol Use: No    OB History   Grav Para Term Preterm Abortions TAB SAB Ect Mult Living                  Review of Systems  Constitutional: Negative.   Respiratory: Negative.   Cardiovascular: Negative.     Allergies  Review of patient's allergies indicates no known allergies.  Home Medications   Current Outpatient Rx  Name  Route  Sig  Dispense  Refill  . albuterol (PROVENTIL HFA;VENTOLIN HFA) 108 (90 BASE) MCG/ACT inhaler   Inhalation   Inhale 2 puffs into the lungs daily as needed.           .  chlorthalidone (HYGROTON) 25 MG tablet   Oral   Take 25 mg by mouth daily.         Marland Kitchen amoxicillin (AMOXIL) 500 MG capsule   Oral   Take 1 capsule (500 mg total) by mouth 3 (three) times daily.   30 capsule   0   . chlorthalidone (HYGROTON) 25 MG tablet   Oral   Take 1 tablet (25 mg total) by mouth daily.   30 tablet   0   . furosemide (LASIX) 40 MG tablet   Oral   Take 40 mg by mouth daily.           . nitrofurantoin, macrocrystal-monohydrate, (MACROBID) 100 MG capsule   Oral   Take 1 capsule (100 mg total) by mouth 2 (two) times daily. X 7 days   14 capsule   0   . Oxcarbazepine (TRILEPTAL) 300 MG tablet   Oral   Take 900 mg by mouth 3 (three) times daily.             BP 183/93  Pulse 80  Temp(Src) 98.4 F (36.9 C) (Oral)  Resp 20  Ht 5\' 1"  (1.549 m)  Wt 285 lb (129.275 kg)  BMI 53.88 kg/m2  SpO2 94%  LMP 04/02/2013  Physical Exam  Nursing note and vitals reviewed. Constitutional: She is oriented to person, place, and time. She appears well-developed and well-nourished.  HENT:  Right Ear: External ear normal.  Left Ear: External ear normal.  Nose: Mucosal edema present. Right sinus exhibits frontal sinus tenderness. Left sinus exhibits frontal sinus tenderness.  Mouth/Throat: Oropharynx is clear and moist.  Eyes: Conjunctivae and EOM are normal.  Neck: Normal range of motion. Neck supple.  Cardiovascular: Normal rate and regular rhythm.   Pulmonary/Chest: Effort normal and breath sounds normal.  Abdominal: Soft. Bowel sounds are normal.  Musculoskeletal: Normal range of motion.  Neurological: She is alert and oriented to person, place, and time.  Skin: Skin is warm and dry.  Psychiatric: She has a normal mood and affect.    ED Course  Procedures (including critical care time)  Labs Reviewed  CBC WITH DIFFERENTIAL - Abnormal; Notable for the following:    Monocytes Relative 13 (*)    All other components within normal limits  BASIC METABOLIC  PANEL - Abnormal; Notable for the following:    Glucose, Bld 121 (*)    All other components within normal limits  TROPONIN I  URINALYSIS, ROUTINE W REFLEX MICROSCOPIC  PREGNANCY, URINE   Dg Chest 2 View  04/19/2013   *RADIOLOGY REPORT*  Clinical Data: Nasal congestion, shortness of breath.  CHEST - 2 VIEW  Comparison: 01/28/2013  Findings: There is a scarring in the lingula and right lower lung again noted, stable.  Heart is upper limits normal in size.  No acute opacities.  No effusions.  No acute bony abnormality.  IMPRESSION: Areas of bibasilar scarring, stable.  No acute findings.   Original Report Authenticated By: Charlett Nose, M.D.    Date: 04/19/2013  Rate: 84  Rhythm: normal sinus rhythm  QRS Axis: normal  Intervals: normal  ST/T Wave abnormalities: normal  Conduction Disutrbances:none  Narrative Interpretation:   Old EKG Reviewed: none available   1. HTN (hypertension)   2. Sinusitis       MDM  Will treat for sinusitis and put pt back on htn medications:unsure if pt truly had a syncopal episode of if it was near syncope as pt could not clarify whether she end up with loc:pt has no sign of injury        Teressa Lower, NP 04/19/13 2134

## 2013-11-04 ENCOUNTER — Emergency Department (HOSPITAL_BASED_OUTPATIENT_CLINIC_OR_DEPARTMENT_OTHER)
Admission: EM | Admit: 2013-11-04 | Discharge: 2013-11-04 | Disposition: A | Discharge status: Home / Self Care | Payer: BC Managed Care – PPO | Attending: Emergency Medicine | Admitting: Emergency Medicine

## 2013-11-04 ENCOUNTER — Encounter (HOSPITAL_BASED_OUTPATIENT_CLINIC_OR_DEPARTMENT_OTHER): Payer: Self-pay | Admitting: Emergency Medicine

## 2013-11-04 DIAGNOSIS — J45909 Unspecified asthma, uncomplicated: Secondary | ICD-10-CM | POA: Insufficient documentation

## 2013-11-04 DIAGNOSIS — I10 Essential (primary) hypertension: Secondary | ICD-10-CM | POA: Insufficient documentation

## 2013-11-04 DIAGNOSIS — F17200 Nicotine dependence, unspecified, uncomplicated: Secondary | ICD-10-CM | POA: Insufficient documentation

## 2013-11-04 DIAGNOSIS — E119 Type 2 diabetes mellitus without complications: Secondary | ICD-10-CM | POA: Insufficient documentation

## 2013-11-04 DIAGNOSIS — E669 Obesity, unspecified: Secondary | ICD-10-CM | POA: Insufficient documentation

## 2013-11-04 DIAGNOSIS — Z792 Long term (current) use of antibiotics: Secondary | ICD-10-CM | POA: Insufficient documentation

## 2013-11-04 DIAGNOSIS — Z8669 Personal history of other diseases of the nervous system and sense organs: Secondary | ICD-10-CM | POA: Insufficient documentation

## 2013-11-04 DIAGNOSIS — J069 Acute upper respiratory infection, unspecified: Secondary | ICD-10-CM

## 2013-11-04 DIAGNOSIS — Z79899 Other long term (current) drug therapy: Secondary | ICD-10-CM | POA: Insufficient documentation

## 2013-11-04 NOTE — ED Notes (Signed)
MD at bedside. 

## 2013-11-04 NOTE — ED Notes (Signed)
Cough x 1 week

## 2013-11-04 NOTE — Discharge Instructions (Signed)
Cool Mist Vaporizers Use a cool mist humidifier and you can also squirt saline nasal spray into each nostril every 2 hours while awake to help with sinus congestion. Call the [facility] get primary care physician.Ask your new physician to help you to stop smoking, as smoking leads to colds and sinus infections Vaporizers may help relieve the symptoms of a cough and cold. They add moisture to the air, which helps mucus to become thinner and less sticky. This makes it easier to breathe and cough up secretions. Cool mist vaporizers do not cause serious burns like hot mist vaporizers ("steamers, humidifiers"). Vaporizers have not been proved to show they help with colds. You should not use a vaporizer if you are allergic to mold.  HOME CARE INSTRUCTIONS  Follow the package instructions for the vaporizer.  Do not use anything other than distilled water in the vaporizer.  Do not run the vaporizer all of the time. This can cause mold or bacteria to grow in the vaporizer.  Clean the vaporizer after each time it is used.  Clean and dry the vaporizer well before storing it.  Stop using the vaporizer if worsening respiratory symptoms develop. Document Released: 07/16/2004 Document Revised: 06/21/2013 Document Reviewed: 03/08/2013 [facility] Patient Information 2014 [employer].

## 2013-11-04 NOTE — ED Provider Notes (Signed)
CSN: [id number]     Arrival date & time 11/04/13  1828 History  This chart was scribed for Doug SouSam Jc Veron, MD by Leone PayorSonum Patel, ED Scribe. This patient was seen in room MHT13/MHT13 and the patient's care was started 9:36 PM.    Chief Complaint  Patient presents with  . Cough    The history is provided by the patient. No language interpreter was used.    HPI Comments: Megan Fasterudrey Riva is a 43 y.o. female who presents to the Emergency Department complaining of 2 days of intermittent episodes of a non-productive cough with associated congestion and sinus pressure. Pt reports having dry throat and nasal passages. She denies fever. She is a daily smoker and occasionally drinks alcohol.   Past Medical History  Diagnosis Date  . Hypertension   . Asthma   . Diabetes mellitus   . Trigeminal neuralgia    Past Surgical History  Procedure Laterality Date  . Laparoscopic tubal ligation     No family history on file. History  Substance Use Topics  . Smoking status: Current Every Day Smoker    Types: Cigarettes  . Smokeless tobacco: Never Used  . Alcohol Use: 1.2 oz/week    1 Glasses of wine, 1 Cans of beer per week   OB History   Grav Para Term Preterm Abortions TAB SAB Ect Mult Living                 Review of Systems  Constitutional: Negative.  Negative for fever.  HENT: Positive for sinus pressure.   Respiratory: Positive for cough.   Cardiovascular: Negative.   Gastrointestinal: Negative.   Musculoskeletal: Negative.   Skin: Negative.   Neurological: Negative.   Psychiatric/Behavioral: Negative.     Allergies  Review of patient's allergies indicates no known allergies.  Home Medications   Current Outpatient Rx  Name  Route  Sig  Dispense  Refill  . albuterol (PROVENTIL HFA;VENTOLIN HFA) 108 (90 BASE) MCG/ACT inhaler   Inhalation   Inhale 2 puffs into the lungs daily as needed.           . chlorthalidone (HYGROTON) 25 MG tablet   Oral   Take 25 mg by mouth daily.          . furosemide (LASIX) 40 MG tablet   Oral   Take 40 mg by mouth daily.           . Oxcarbazepine (TRILEPTAL) 300 MG tablet   Oral   Take 900 mg by mouth 3 (three) times daily.           Marland Kitchen. amoxicillin (AMOXIL) 500 MG capsule   Oral   Take 1 capsule (500 mg total) by mouth 3 (three) times daily.   30 capsule   0   . chlorthalidone (HYGROTON) 25 MG tablet   Oral   Take 1 tablet (25 mg total) by mouth daily.   30 tablet   0   . nitrofurantoin, macrocrystal-monohydrate, (MACROBID) 100 MG capsule   Oral   Take 1 capsule (100 mg total) by mouth 2 (two) times daily. X 7 days   14 capsule   0    BP 179/94  Pulse 88  Temp(Src) 98.6 F (37 C) (Oral)  Resp 18  Ht 5\' 1"  (1.549 m)  Wt 287 lb (130.182 kg)  BMI 54.26 kg/m2  SpO2 100%  LMP 10/16/2013 Physical Exam  Nursing note and vitals reviewed. Constitutional: She appears well-developed and well-nourished.  HENT:  Head: Normocephalic and  atraumatic.  Right Ear: External ear normal.  Left Ear: External ear normal.  Bilateral tympanic membranes normal. Positive nasal congestion or oral pharynx normal  Eyes: Conjunctivae are normal. Pupils are equal, round, and reactive to light.  Neck: Neck supple. No tracheal deviation present. No thyromegaly present.  Cardiovascular: Normal rate and regular rhythm.   No murmur heard. Pulmonary/Chest: Effort normal and breath sounds normal.  Abdominal: Soft. Bowel sounds are normal. She exhibits no distension. There is no tenderness.  Obese  Musculoskeletal: Normal range of motion. She exhibits no edema and no tenderness.  Neurological: She is alert. Coordination normal.  Skin: Skin is warm and dry. No rash noted.  Psychiatric: She has a normal mood and affect.    ED Course  Procedures   DIAGNOSTIC STUDIES: Oxygen Saturation is 100% on RA, normal by my interpretation.    COORDINATION OF CARE: 9:46 PM Discussed treatment plan with pt at bedside and pt agreed to plan.     Labs Review Labs Reviewed - No data to display Imaging Review No results found.  EKG Interpretation   None       MDM  No diagnosis found. Plan cool mist humidifier consultation for 5 minutes on smoking cessation. [facility] to get a primary care physician Diagnosis #1 upper respiratory tract infection #2 tobacco abuse   I personally performed the services described in this documentation, which was scribed in my presence. The recorded information has been reviewed and considered.      Doug Sou, MD 11/04/13 2231

## 2014-02-25 ENCOUNTER — Emergency Department (HOSPITAL_BASED_OUTPATIENT_CLINIC_OR_DEPARTMENT_OTHER)
Admission: EM | Admit: 2014-02-25 | Discharge: 2014-02-25 | Disposition: A | Discharge status: Home / Self Care | Payer: Worker's Compensation | Attending: Emergency Medicine | Admitting: Emergency Medicine

## 2014-02-25 DIAGNOSIS — IMO0002 Reserved for concepts with insufficient information to code with codable children: Secondary | ICD-10-CM | POA: Insufficient documentation

## 2014-02-25 DIAGNOSIS — E119 Type 2 diabetes mellitus without complications: Secondary | ICD-10-CM | POA: Insufficient documentation

## 2014-02-25 DIAGNOSIS — J45909 Unspecified asthma, uncomplicated: Secondary | ICD-10-CM | POA: Insufficient documentation

## 2014-02-25 DIAGNOSIS — Z8669 Personal history of other diseases of the nervous system and sense organs: Secondary | ICD-10-CM | POA: Insufficient documentation

## 2014-02-25 DIAGNOSIS — I10 Essential (primary) hypertension: Secondary | ICD-10-CM | POA: Insufficient documentation

## 2014-02-25 DIAGNOSIS — M5416 Radiculopathy, lumbar region: Secondary | ICD-10-CM

## 2014-02-25 DIAGNOSIS — Z79899 Other long term (current) drug therapy: Secondary | ICD-10-CM | POA: Insufficient documentation

## 2014-02-25 DIAGNOSIS — F17200 Nicotine dependence, unspecified, uncomplicated: Secondary | ICD-10-CM | POA: Insufficient documentation

## 2014-02-25 LAB — CBG MONITORING, ED: GLUCOSE-CAPILLARY: 88 mg/dL (ref 70–99)

## 2014-02-25 MED ORDER — OXYCODONE-ACETAMINOPHEN 5-325 MG PO TABS: 2.0000 | ORAL_TABLET | ORAL | Status: DC | PRN

## 2014-02-25 MED ORDER — DEXAMETHASONE SODIUM PHOSPHATE 10 MG/ML IJ SOLN
10.0000 mg | Freq: Once | INTRAMUSCULAR | Status: AC
  Administered 2014-02-25: 10 mg via INTRAMUSCULAR
  Filled 2014-02-25: qty 1

## 2014-02-25 MED ORDER — CYCLOBENZAPRINE HCL 10 MG PO TABS: 10.0000 mg | ORAL_TABLET | Freq: Two times a day (BID) | ORAL | Status: DC | PRN

## 2014-02-25 MED ORDER — HYDROMORPHONE HCL PF 2 MG/ML IJ SOLN
2.0000 mg | Freq: Once | INTRAMUSCULAR | Status: AC
  Administered 2014-02-25: 2 mg via INTRAMUSCULAR
  Filled 2014-02-25: qty 1

## 2014-02-25 MED ORDER — ONDANSETRON 8 MG PO TBDP
8.0000 mg | ORAL_TABLET | Freq: Once | ORAL | Status: AC
  Administered 2014-02-25: 8 mg via ORAL
  Filled 2014-02-25: qty 1

## 2014-02-25 NOTE — ED Notes (Signed)
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 Now back pain with any movement.

## 2014-02-25 NOTE — Discharge Instructions (Signed)

## 2014-02-25 NOTE — ED Provider Notes (Signed)
CSN: [id number]     Arrival date & time 02/25/14  1551 History  This chart was scribed for Rolan BuccoMelanie Rossana Molchan, MD by Danella Maiersaroline Early, ED Scribe. This patient was seen in room MH12/MH12 and the patient's care was started at 4:14 PM.    Chief Complaint  Patient presents with  . Back Pain   The history is provided by the patient. No language interpreter was used.   HPI Comments: Megan Fasterudrey Casamento is a 43 y.o. 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 She states the pain stops at the thigh. Pain worsens with movement. She denies numbness or weakness in the legs or numness to perianal/groin area. She denies urinary or bowel incontinence. She denies prior h/o back problems. She denies injuries anywhere else. [facility]   Past Medical History  Diagnosis Date  . Hypertension   . Asthma   . Diabetes mellitus   . Trigeminal neuralgia    Past Surgical History  Procedure Laterality Date  . Laparoscopic tubal ligation     No family history on file. History  Substance Use Topics  . Smoking status: Current Every Day Smoker    Types: Cigarettes  . Smokeless tobacco: Never Used  . Alcohol Use: 1.2 oz/week    1 Glasses of wine, 1 Cans of beer per week   OB History   Grav Para Term Preterm Abortions TAB SAB Ect Mult Living                 Review of Systems  Constitutional: Negative for fever, chills, diaphoresis and fatigue.  HENT: Negative for congestion, rhinorrhea and sneezing.   Eyes: Negative.   Respiratory: Negative for cough, chest tightness and shortness of breath.   Cardiovascular: Negative for chest pain and leg swelling.  Gastrointestinal: Negative for nausea, vomiting, abdominal pain, diarrhea and blood in stool.  Genitourinary: Negative for frequency, hematuria, flank pain and difficulty  urinating.  Musculoskeletal: Positive for back pain. Negative for arthralgias.  Skin: Negative for rash.  Neurological: Negative for dizziness, speech difficulty, weakness, numbness and headaches.      Allergies  Review of patient's allergies indicates no known allergies.  Home Medications   Prior to Admission medications   Medication Sig Start Date End Date Taking? Authorizing Provider  albuterol (PROVENTIL HFA;VENTOLIN HFA) 108 (90 BASE) MCG/ACT inhaler Inhale 2 puffs into the lungs daily as needed.      Historical Provider, MD  furosemide (LASIX) 40 MG tablet Take 40 mg by mouth daily.      Historical Provider, MD  Oxcarbazepine (TRILEPTAL) 300 MG tablet Take 900 mg by mouth 3 (three) times daily.      Historical Provider, MD   BP 184/97  Pulse 89  Temp(Src) 97.8 F (36.6 C)  Resp 28  SpO2 99% Physical Exam  Constitutional: She is oriented to person, place, and time. She appears well-developed and well-nourished.  HENT:  Head: Normocephalic and atraumatic.  Eyes: Pupils are equal, round, and reactive to light.  Neck: Normal range of motion. Neck supple.  Cardiovascular: Normal rate, regular rhythm and normal heart sounds.   Pulmonary/Chest: Effort normal and breath sounds normal. No respiratory distress. She has no wheezes. She has no rales. She exhibits no tenderness.  Abdominal: Soft. Bowel sounds are normal. There is no tenderness. There is no rebound  and no guarding.  Musculoskeletal: Normal range of motion. She exhibits no edema.  Tenderness to the right paraspinal area in the lumbar region. Positive straight leg raise on the right. Patellar reflexes diminished bilaterally.  Lymphadenopathy:    She has no cervical adenopathy.  Neurological: She is alert and oriented to person, place, and time.  Normal motor function and sensation in the legs  Skin: Skin is warm and dry. No rash noted.  Psychiatric: She has a normal mood and affect.    ED Course  Procedures  (including critical care time) Medications  HYDROmorphone (DILAUDID) injection 2 mg (2 mg Intramuscular Given 02/25/14 1638)  dexamethasone (DECADRON) injection 10 mg (10 mg Intramuscular Given 02/25/14 1638)  ondansetron (ZOFRAN-ODT) disintegrating tablet 8 mg (8 mg Oral Given 02/25/14 1637)    DIAGNOSTIC STUDIES: Oxygen Saturation is 99% on RA, normal by my interpretation.    COORDINATION OF CARE: 4:24 PM- Discussed treatment plan with pt which includes steroid and pain med injections. Plan to discharge home with pain meds and muscle relaxers. Pt agrees to plan.     ` Labs Review Labs Reviewed  CBG MONITORING, ED    Imaging Review No results found.   EKG Interpretation None      MDM   Final diagnoses:  Lumbar radiculopathy    Patient is given Decadron and Dilaudid in the ED. She was given prescriptions for Percocet to use at home. She has a plan to followup with her workers comp physician. Currently she does not have any neurologic deficits or signs of cauda equina.  I personally performed the services described in this documentation, which was scribed in my presence.  The recorded information has been reviewed and considered.    Rolan BuccoMelanie Deisi Salonga, MD 02/25/14 [phone]

## 2014-02-26 NOTE — ED Notes (Signed)
Patient came by and requested a note to be out of work.  Chart reviewed with Dr. Oletta CohnPolina.  OK to give a note for three days, then patient will need to follow up with her PCP.

## 2015-07-17 ENCOUNTER — Ambulatory Visit: Payer: Self-pay | Admitting: Obstetrics & Gynecology

## 2016-01-27 ENCOUNTER — Emergency Department (HOSPITAL_BASED_OUTPATIENT_CLINIC_OR_DEPARTMENT_OTHER)
Admission: EM | Admit: 2016-01-27 | Discharge: 2016-01-27 | Disposition: A | Discharge status: Home / Self Care | Payer: Medicaid Other | Attending: Emergency Medicine | Admitting: Emergency Medicine

## 2016-01-27 ENCOUNTER — Emergency Department (HOSPITAL_BASED_OUTPATIENT_CLINIC_OR_DEPARTMENT_OTHER): Payer: Medicaid Other

## 2016-01-27 ENCOUNTER — Encounter (HOSPITAL_BASED_OUTPATIENT_CLINIC_OR_DEPARTMENT_OTHER): Payer: Self-pay | Admitting: *Deleted

## 2016-01-27 DIAGNOSIS — Z87891 Personal history of nicotine dependence: Secondary | ICD-10-CM | POA: Insufficient documentation

## 2016-01-27 DIAGNOSIS — J159 Unspecified bacterial pneumonia: Secondary | ICD-10-CM | POA: Diagnosis not present

## 2016-01-27 DIAGNOSIS — Z8632 Personal history of gestational diabetes: Secondary | ICD-10-CM | POA: Diagnosis not present

## 2016-01-27 DIAGNOSIS — E119 Type 2 diabetes mellitus without complications: Secondary | ICD-10-CM | POA: Insufficient documentation

## 2016-01-27 DIAGNOSIS — L03115 Cellulitis of right lower limb: Secondary | ICD-10-CM | POA: Insufficient documentation

## 2016-01-27 DIAGNOSIS — J45901 Unspecified asthma with (acute) exacerbation: Secondary | ICD-10-CM | POA: Diagnosis not present

## 2016-01-27 DIAGNOSIS — I10 Essential (primary) hypertension: Secondary | ICD-10-CM | POA: Insufficient documentation

## 2016-01-27 DIAGNOSIS — R7989 Other specified abnormal findings of blood chemistry: Secondary | ICD-10-CM

## 2016-01-27 DIAGNOSIS — M7989 Other specified soft tissue disorders: Secondary | ICD-10-CM

## 2016-01-27 DIAGNOSIS — Z79899 Other long term (current) drug therapy: Secondary | ICD-10-CM | POA: Insufficient documentation

## 2016-01-27 DIAGNOSIS — E669 Obesity, unspecified: Secondary | ICD-10-CM | POA: Diagnosis not present

## 2016-01-27 DIAGNOSIS — R791 Abnormal coagulation profile: Secondary | ICD-10-CM | POA: Insufficient documentation

## 2016-01-27 DIAGNOSIS — R0602 Shortness of breath: Secondary | ICD-10-CM

## 2016-01-27 DIAGNOSIS — J189 Pneumonia, unspecified organism: Secondary | ICD-10-CM

## 2016-01-27 HISTORY — DX: Gestational diabetes mellitus in pregnancy, unspecified control: O24.419

## 2016-01-27 LAB — CBC WITH DIFFERENTIAL/PLATELET
BASOS ABS: 0 10*3/uL (ref 0.0–0.1)
BASOS PCT: 0 %
EOS ABS: 0.1 10*3/uL (ref 0.0–0.7)
Eosinophils Relative: 2 %
HEMATOCRIT: 37.3 % (ref 36.0–46.0)
HEMOGLOBIN: 11.6 g/dL — AB (ref 12.0–15.0)
Lymphocytes Relative: 24 %
Lymphs Abs: 1.4 10*3/uL (ref 0.7–4.0)
MCH: 25.4 pg — ABNORMAL LOW (ref 26.0–34.0)
MCHC: 31.1 g/dL (ref 30.0–36.0)
MCV: 81.8 fL (ref 78.0–100.0)
MONO ABS: 0.5 10*3/uL (ref 0.1–1.0)
MONOS PCT: 9 %
NEUTROS ABS: 3.8 10*3/uL (ref 1.7–7.7)
NEUTROS PCT: 65 %
Platelets: 427 10*3/uL — ABNORMAL HIGH (ref 150–400)
RBC: 4.56 MIL/uL (ref 3.87–5.11)
RDW: 15.9 % — AB (ref 11.5–15.5)
WBC: 5.9 10*3/uL (ref 4.0–10.5)

## 2016-01-27 LAB — COMPREHENSIVE METABOLIC PANEL
ALT: 14 U/L (ref 14–54)
AST: 19 U/L (ref 15–41)
Albumin: 3.1 g/dL — ABNORMAL LOW (ref 3.5–5.0)
Alkaline Phosphatase: 79 U/L (ref 38–126)
Anion gap: 7 (ref 5–15)
BUN: 10 mg/dL (ref 6–20)
CHLORIDE: 101 mmol/L (ref 101–111)
CO2: 29 mmol/L (ref 22–32)
Calcium: 8.7 mg/dL — ABNORMAL LOW (ref 8.9–10.3)
Creatinine, Ser: 0.81 mg/dL (ref 0.44–1.00)
Glucose, Bld: 113 mg/dL — ABNORMAL HIGH (ref 65–99)
POTASSIUM: 3.8 mmol/L (ref 3.5–5.1)
SODIUM: 137 mmol/L (ref 135–145)
Total Bilirubin: 0.2 mg/dL — ABNORMAL LOW (ref 0.3–1.2)
Total Protein: 7.8 g/dL (ref 6.5–8.1)

## 2016-01-27 LAB — BRAIN NATRIURETIC PEPTIDE: B NATRIURETIC PEPTIDE 5: 6.8 pg/mL (ref 0.0–100.0)

## 2016-01-27 LAB — D-DIMER, QUANTITATIVE (NOT AT ARMC): D DIMER QUANT: 0.7 ug{FEU}/mL — AB (ref 0.00–0.50)

## 2016-01-27 LAB — TROPONIN I

## 2016-01-27 MED ORDER — IPRATROPIUM-ALBUTEROL 0.5-2.5 (3) MG/3ML IN SOLN
3.0000 mL | Freq: Once | RESPIRATORY_TRACT | Status: AC
  Administered 2016-01-27: 3 mL via RESPIRATORY_TRACT
  Filled 2016-01-27: qty 3

## 2016-01-27 MED ORDER — IOHEXOL 350 MG/ML SOLN
100.0000 mL | Freq: Once | INTRAVENOUS | Status: AC | PRN
  Administered 2016-01-27: 100 mL via INTRAVENOUS

## 2016-01-27 MED ORDER — ALBUTEROL SULFATE (2.5 MG/3ML) 0.083% IN NEBU
2.5000 mg | INHALATION_SOLUTION | Freq: Once | RESPIRATORY_TRACT | Status: AC
  Administered 2016-01-27: 2.5 mg via RESPIRATORY_TRACT
  Filled 2016-01-27: qty 3

## 2016-01-27 MED ORDER — HYDROCODONE-ACETAMINOPHEN 5-325 MG PO TABS: 1.0000 | ORAL_TABLET | Freq: Four times a day (QID) | ORAL | Status: DC | PRN

## 2016-01-27 MED ORDER — DOXYCYCLINE HYCLATE 100 MG PO CAPS: 200.0000 mg | ORAL_CAPSULE | Freq: Two times a day (BID) | ORAL | Status: DC

## 2016-01-27 MED ORDER — DOXYCYCLINE HYCLATE 100 MG PO TABS
200.0000 mg | ORAL_TABLET | Freq: Once | ORAL | Status: AC
  Administered 2016-01-27: 200 mg via ORAL
  Filled 2016-01-27: qty 2

## 2016-01-27 MED FILL — DOXYCYCLINE HYC 100 MG CAP: 100 | 10 days supply | Qty: 40 | Fill #0

## 2016-01-27 MED FILL — HYDROCODON-APAP 5-325: 5-325 | 1 days supply | Qty: 5 | Fill #0

## 2016-01-27 NOTE — ED Notes (Signed)
PA at bedside.

## 2016-01-27 NOTE — ED Provider Notes (Signed)
CSN: [id number]     Arrival date & time 01/27/16  1030 History   First MD Initiated Contact with Patient 01/27/16 1046     Chief Complaint  Patient presents with  . Leg Swelling     (Consider location/radiation/quality/duration/timing/severity/associated sxs/prior Treatment) The history is provided by the patient and medical records. No language interpreter was used.     Megan Barrett is a 45 y.o. female  with a PMH of HTN, DM, asthma who presents to the Emergency Department for two complaints:   Patient had a new tattoo on her right lower leg 3 days ago. Patient endorses weeping from the site with surrounding erythema times one day. Associated swelling. Denies pain, fever.   Upon arrival, patient with increased effort of breathing and audible wheezing. Patient states this has been an ongoing issue over the last 2 months. Shortness of breath is worse with exertion and associated with an intermittently productive cough. She has albuterol at home that provides mild relief, however she did not use albuterol today. States she was diagnosed with bronchitis a few years ago, but has not had a flare recently. Admits to associated bilateral leg swelling x 2-3 months as well. Denies chest pain, abdominal pain, n/v/d.   Past Medical History  Diagnosis Date  . Hypertension   . Asthma   . Gestational diabetes   . Trigeminal neuralgia    Past Surgical History  Procedure Laterality Date  . Laparoscopic tubal ligation     No family history on file. Social History  Substance Use Topics  . Smoking status: Former Smoker    Types: Cigarettes    Quit date: 08/03/2015  . Smokeless tobacco: Never Used  . Alcohol Use: 1.2 oz/week    1 Glasses of wine, 1 Cans of beer per week   OB History    No data available     Review of Systems  Constitutional: Negative for fever and chills.  HENT: Negative for congestion and sore throat.   Eyes: Negative for visual disturbance.  Respiratory: Positive for  cough, shortness of breath and wheezing.   Cardiovascular: Positive for leg swelling. Negative for chest pain and palpitations.  Gastrointestinal: Negative for nausea, vomiting and abdominal pain.  Genitourinary: Negative for dysuria.  Musculoskeletal: Negative for back pain and neck pain.  Skin: Positive for color change.  Neurological: Negative for dizziness, weakness and headaches.      Allergies  Review of patient's allergies indicates no known allergies.  Home Medications   Prior to Admission medications   Medication Sig Start Date End Date Taking? Authorizing Provider  albuterol (PROVENTIL HFA;VENTOLIN HFA) 108 (90 BASE) MCG/ACT inhaler Inhale 2 puffs into the lungs daily as needed.     Yes Historical Provider, MD  ipratropium-albuterol (DUONEB) 0.5-2.5 (3) MG/3ML SOLN Take 3 mLs by nebulization.   Yes Historical Provider, MD  Oxcarbazepine (TRILEPTAL) 300 MG tablet Take 900 mg by mouth 3 (three) times daily.     Yes Historical Provider, MD  doxycycline (VIBRAMYCIN) 100 MG capsule Take 2 capsules (200 mg total) by mouth 2 (two) times daily. 01/27/16   Chase Picket Gwenevere Goga, PA-C  furosemide (LASIX) 40 MG tablet Take 40 mg by mouth daily.      Historical Provider, MD  HYDROcodone-acetaminophen (NORCO/VICODIN) 5-325 MG tablet Take 1 tablet by mouth every 6 (six) hours as needed for severe pain. 01/27/16   Chase Picket Keylin Ferryman, PA-C  oxyCODONE-acetaminophen (PERCOCET) 5-325 MG per tablet Take 2 tablets by mouth every 4 (four) hours as  needed. 02/25/14   Rolan Bucco, MD   BP 160/91 mmHg  Pulse 96  Temp(Src) 98.2 F (36.8 C) (Oral)  Resp 18  Ht 5\' 1"  (1.549 m)  Wt 139.708 kg  BMI 58.23 kg/m2  SpO2 98%  LMP 01/13/2016 (Approximate) Physical Exam  Constitutional: She is oriented to person, place, and time.  WDWN obese female.  HENT:  Head: Normocephalic and atraumatic.  Cardiovascular: Normal rate, regular rhythm and normal heart sounds.  Exam reveals no gallop and no friction rub.    No murmur heard. Equal and intact distal pulses.   Pulmonary/Chest: She exhibits no tenderness.  Increased effort of breathing, inspiratory and expiratory wheezing throughout all lung fields.   Abdominal: Soft. Bowel sounds are normal. She exhibits no distension and no mass. There is no tenderness. There is no rebound and no guarding.  Musculoskeletal:  5/5 muscle strength in bilateral lower extremities. Full range of motion. Bilateral pitting edema.  Neurological: She is alert and oriented to person, place, and time.  Sensation intact in bilateral lower extremities.  Skin:  Right lower extremity with tattoo - mild erythema surrounding ink. Mildly warm to the touch. Clear leaking fluid from area.   Psychiatric: She has a normal mood and affect. Her behavior is normal. Judgment and thought content normal.  Nursing note and vitals reviewed.   ED Course  Procedures (including critical care time) Labs Review Labs Reviewed  CBC WITH DIFFERENTIAL/PLATELET - Abnormal; Notable for the following:    Hemoglobin 11.6 (*)    MCH 25.4 (*)    RDW 15.9 (*)    Platelets 427 (*)    All other components within normal limits  COMPREHENSIVE METABOLIC PANEL - Abnormal; Notable for the following:    Glucose, Bld 113 (*)    Calcium 8.7 (*)    Albumin 3.1 (*)    Total Bilirubin 0.2 (*)    All other components within normal limits  D-DIMER, QUANTITATIVE (NOT AT [facility]) - Abnormal; Notable for the following:    D-Dimer, Quant 0.70 (*)    All other components within normal limits  BRAIN NATRIURETIC PEPTIDE  TROPONIN I    Imaging Review Dg Chest 2 View  01/27/2016  CLINICAL DATA:  Shortness of breath and lower extremity edema EXAM: CHEST  2 VIEW COMPARISON:  December 31, 2015 FINDINGS: There are areas of scarring in both mid lung regions as well as in the left base. There is no edema or consolidation. Heart size is upper normal with pulmonary vascularity within normal limits. No adenopathy. There is  degenerative change in the thoracic spine. IMPRESSION: Stable scarring bilaterally. No edema or consolidation. No change in cardiac silhouette. Electronically Signed   By: Bretta Bang III M.D.   On: 01/27/2016 11:31   Ct Angio Chest Pe W/cm &/or Wo Cm  01/27/2016  CLINICAL DATA:  Shortness of breath and chest pain for several days EXAM: CT ANGIOGRAPHY CHEST WITH CONTRAST TECHNIQUE: Multidetector CT imaging of the chest was performed using the standard protocol during bolus administration of intravenous contrast. Multiplanar CT image reconstructions and MIPs were obtained to evaluate the vascular anatomy. CONTRAST:  OMNIPAQUE IOHEXOL 350 MG/ML SOLN COMPARISON:  Chest radiograph January 27, 2016 FINDINGS: Mediastinum/Lymph Nodes: The bolus is less than optimal for evaluation for pulmonary embolus. No major vessel pulmonary embolus is seen. To the extent that the peripheral pulmonary arteries can't be evaluated, no pulmonary embolus is evident. There is no thoracic aortic aneurysm or dissection. The great vessels which are  visualized appear normal. The right common and left common carotid arteries arise as a common trunk, an anatomic variant. The pericardium is not thickened. There is left ventricular hypertrophy. Visualized thyroid appears normal. There are multiple small mediastinal lymph nodes. There is a lymph node to the left of the carina measuring 1.6 x 1.4 cm. There are several mildly prominent sub- carinal lymph nodes, largest measuring 1.7 x 1.2 cm. There is a pretracheal lymph node measuring 1.3 x 0.9 cm. Lungs/Pleura: There are patchy areas of airspace consolidation in the lateral and posterior segments of the left lower lobe and in the right upper lobe posterior segment near the major fissure. Upper abdomen: Visualized upper abdominal structures are unremarkable. Musculoskeletal: There is degenerative change in the thoracic spine. There are no blastic or lytic bone lesions. Review of the MIP  images confirms the above findings. IMPRESSION: Less than optimal contrast bolus of the pulmonary arteries. No demonstrable pulmonary embolus to the extent that the pulmonary arteries can be evaluated. Areas of patchy infiltrate, most likely pneumonia in the posterior segment right upper lobe and in the lateral and posterior segments of the left lower lobe. Several mildly prominent lymph nodes, possibly of reactive etiology. Left ventricular hypertrophy is present. Electronically Signed   By: Bretta Bang III M.D.   On: 01/27/2016 13:33   US Venous Img Lower Unilateral Right  01/27/2016  CLINICAL DATA:  Right calf edema around new tattoo x 3 days. Bilateral leg swelling, shortness breath x3 months, previous tobacco abuse EXAM: RIGHT LOWER EXTREMITY VENOUS DOPPLER ULTRASOUND TECHNIQUE: Gray-scale sonography with compression, as well as color and duplex ultrasound, were performed to evaluate the deep venous system from the level of the common femoral vein through the popliteal and proximal calf veins. COMPARISON:  None FINDINGS: Normal compressibility of the common femoral, superficial femoral, and popliteal veins, as well as the proximal calf veins. No filling defects to suggest DVT on grayscale or color Doppler imaging. Doppler waveforms show normal direction of venous flow, normal respiratory phasicity and response to augmentation. Visualized segments of the saphenous venous system normal in caliber and compressibility. Prominent right inguinal lymph nodes measuring up to 17 mm short axis diameter. Survey views of the contralateral common femoral vein are unremarkable. IMPRESSION: 1. No evidence of lower extremity deep vein thrombosis, right. 2. Enlarged right inguinal lymph nodes, possibly reactive but nonspecific. Electronically Signed   By: Corlis Leak M.D.   On: 01/27/2016 16:04   I have personally reviewed and evaluated these images and lab results as part of my medical decision-making.   EKG  Interpretation None      MDM   Final diagnoses:  Shortness of breath  Positive D dimer  Right leg swelling  Cellulitis of right lower extremity  Community acquired pneumonia   Evolette Pendell presents with multiple complaints. She presented to ED for likely infection of RLE tattoo that she got 3 days ago. On exam, RLE with mild warmth and erythema. + RLE swelling when compared to left. Patient had audible wheezing and obvious increased effort in breathing upon exam, when questioned about sob, she states breathing has been gradually worsening x 2 months, but she just assumed it was her bronchitis acting up. On exam, patient with bilateral inspiratory and expiratory wheezing. O2 at 94%. DuoNeb ordered. Will obtain labs. Given patient is tachycardic, short of breath, and unilateral leg swelling will also order d-dimer.   Labs: Trop negative, BNP wdl; CBC and CMP reassuring. D-dimer of 0.7, given  this elevated level will obtain CT to rule out PE.  12:18 PM - Patient re-evaluated. Breathing improved per patient, still wheezing on exam - another duoneb ordered and will re-evaluated. Patient 94-96% on RA while awake, per nursing staff, patient fell asleep and stats dropped to mid-low 80's. Placed on [place] O2 by RT.   Imaging: Chest x-ray (see above report), CT shows no evidence of PE, however areas of patchy infiltrate were seen most likely pneumonia in both right upper lobe and lateral and posterior segments of left lower lobe. We will treat for pneumonia with doxycycline given this will also cover for cellulitis of RLE. Given negative CT for PE, DVT study of the right lower extremity was done which was negative for clot.  Patient reevaluated prior to discharge. She is 98% on RA and requesting to be discharged to home. Home care instructions including the importance of compliance with antibiotics was discussed. Patient to follow-up with primary care physician tomorrow (appointment scheduled). Blood  pressure was also elevated-discuss this with PCP as well. Patient states she has albuterol at home, encouraged to use for shortness of breath as needed. I recommend patient trying to get a sleep study performed as soon as possible-she currently has an appointment for May, however I think she would benefit from study performed sooner. Return precautions were given and all questions were answered.  Patient discussed with Dr. Clayborne DanaMesner who agrees with treatment plan.   [facility] Endoscopy CenterJaime Pilcher Alya Smaltz, PA-C 01/27/16 1705  Marily MemosJason Mesner, MD 01/29/16 [phone]

## 2016-01-27 NOTE — Discharge Instructions (Signed)
Please keep your scheduled appointment with your primary care provider. It is very important that you see your primary physician this week. You were diagnosed with pneumonia and also have a skin infection in her right leg. Doxycycline is your antibiotic - Please take all of your antibiotics until finished! This will cover for both infections. Your blood pressure was elevated at today's visit, please let your primary care physician know and follow up on your blood pressure reading. I would also recommend getting into to your sleep study as soon as possible.  Return to the ER for high fevers, difficulty breathing, new or worsening symptoms, any additional concerns.

## 2016-01-27 NOTE — ED Notes (Signed)
Patient transported to X-ray 

## 2016-01-27 NOTE — ED Notes (Signed)
Pt reports weeping from right leg at new tattoo site x 1 day- Tattoo is 483 days old. Pt SOB with exertion and at rest. States has not taken duoneb yet today. RT at bedside to evaluate. Pt reports she has had lower extremity swelling x 2 weeks. States she is no longer taking her fluid pills

## 2016-01-27 NOTE — ED Notes (Signed)
Patient transported to CT 

## 2016-01-27 NOTE — ED Notes (Signed)
Pt and stretcher not in room.

## 2016-01-27 NOTE — ED Notes (Signed)
Pt O2 sats 86-89% on room air while pt resting. Pt reports she has sleep study scheduled in May to evaluate for sleep apnea. While awake O2 sats are 95% on room air

## 2016-01-27 NOTE — ED Notes (Signed)
Taken to pharmacy by wheelchair. Note for work given

## 2016-01-27 NOTE — ED Notes (Signed)
Patient desat while sleeping, patient scheduled sleep study in May for possible sleep apnea.  Patient placed on 2l/m NCO2 SpO2 trending high 90's.

## 2016-01-30 DIAGNOSIS — Z8249 Family history of ischemic heart disease and other diseases of the circulatory system: Secondary | ICD-10-CM

## 2016-01-30 DIAGNOSIS — Z87891 Personal history of nicotine dependence: Secondary | ICD-10-CM

## 2016-01-30 DIAGNOSIS — Z6841 Body Mass Index (BMI) 40.0 and over, adult: Secondary | ICD-10-CM

## 2016-01-30 DIAGNOSIS — R000 Tachycardia, unspecified: Secondary | ICD-10-CM | POA: Diagnosis present

## 2016-01-30 DIAGNOSIS — D649 Anemia, unspecified: Secondary | ICD-10-CM | POA: Diagnosis present

## 2016-01-30 DIAGNOSIS — I10 Essential (primary) hypertension: Secondary | ICD-10-CM | POA: Diagnosis present

## 2016-01-30 DIAGNOSIS — Z833 Family history of diabetes mellitus: Secondary | ICD-10-CM

## 2016-01-30 DIAGNOSIS — Z23 Encounter for immunization: Secondary | ICD-10-CM

## 2016-01-30 DIAGNOSIS — G500 Trigeminal neuralgia: Secondary | ICD-10-CM | POA: Diagnosis present

## 2016-01-30 DIAGNOSIS — Z8632 Personal history of gestational diabetes: Secondary | ICD-10-CM

## 2016-01-30 DIAGNOSIS — J209 Acute bronchitis, unspecified: Secondary | ICD-10-CM | POA: Diagnosis present

## 2016-01-30 DIAGNOSIS — J189 Pneumonia, unspecified organism: Principal | ICD-10-CM | POA: Diagnosis present

## 2016-01-31 ENCOUNTER — Emergency Department (HOSPITAL_BASED_OUTPATIENT_CLINIC_OR_DEPARTMENT_OTHER): Payer: Medicaid Other

## 2016-01-31 ENCOUNTER — Inpatient Hospital Stay (HOSPITAL_BASED_OUTPATIENT_CLINIC_OR_DEPARTMENT_OTHER)
Admission: EM | Admit: 2016-01-31 | Discharge: 2016-02-05 | DRG: 194 | Disposition: A | Discharge status: Home / Self Care | Payer: Medicaid Other | Attending: Internal Medicine | Admitting: Internal Medicine

## 2016-01-31 ENCOUNTER — Encounter (HOSPITAL_BASED_OUTPATIENT_CLINIC_OR_DEPARTMENT_OTHER): Payer: Self-pay

## 2016-01-31 DIAGNOSIS — E669 Obesity, unspecified: Secondary | ICD-10-CM | POA: Diagnosis present

## 2016-01-31 DIAGNOSIS — Z8632 Personal history of gestational diabetes: Secondary | ICD-10-CM | POA: Diagnosis not present

## 2016-01-31 DIAGNOSIS — L03115 Cellulitis of right lower limb: Secondary | ICD-10-CM | POA: Diagnosis present

## 2016-01-31 DIAGNOSIS — R000 Tachycardia, unspecified: Secondary | ICD-10-CM | POA: Diagnosis present

## 2016-01-31 DIAGNOSIS — Z6841 Body Mass Index (BMI) 40.0 and over, adult: Secondary | ICD-10-CM

## 2016-01-31 DIAGNOSIS — Z87891 Personal history of nicotine dependence: Secondary | ICD-10-CM | POA: Diagnosis not present

## 2016-01-31 DIAGNOSIS — G500 Trigeminal neuralgia: Secondary | ICD-10-CM | POA: Diagnosis present

## 2016-01-31 DIAGNOSIS — Z8249 Family history of ischemic heart disease and other diseases of the circulatory system: Secondary | ICD-10-CM | POA: Diagnosis not present

## 2016-01-31 DIAGNOSIS — Z23 Encounter for immunization: Secondary | ICD-10-CM | POA: Diagnosis not present

## 2016-01-31 DIAGNOSIS — D649 Anemia, unspecified: Secondary | ICD-10-CM | POA: Diagnosis present

## 2016-01-31 DIAGNOSIS — R6510 Systemic inflammatory response syndrome (SIRS) of non-infectious origin without acute organ dysfunction: Secondary | ICD-10-CM | POA: Diagnosis present

## 2016-01-31 DIAGNOSIS — Z833 Family history of diabetes mellitus: Secondary | ICD-10-CM | POA: Diagnosis not present

## 2016-01-31 DIAGNOSIS — J189 Pneumonia, unspecified organism: Secondary | ICD-10-CM | POA: Diagnosis present

## 2016-01-31 DIAGNOSIS — J9801 Acute bronchospasm: Secondary | ICD-10-CM

## 2016-01-31 DIAGNOSIS — I10 Essential (primary) hypertension: Secondary | ICD-10-CM | POA: Diagnosis present

## 2016-01-31 DIAGNOSIS — J209 Acute bronchitis, unspecified: Secondary | ICD-10-CM | POA: Diagnosis not present

## 2016-01-31 HISTORY — DX: Body Mass Index (BMI) 40.0 and over, adult: Z684

## 2016-01-31 HISTORY — DX: Morbid (severe) obesity due to excess calories: E66.01

## 2016-01-31 HISTORY — DX: Personal history of other diseases of the female genital tract: Z87.42

## 2016-01-31 HISTORY — DX: Pneumonia, unspecified organism: J18.9

## 2016-01-31 LAB — CBC WITH DIFFERENTIAL/PLATELET
BASOS ABS: 0 10*3/uL (ref 0.0–0.1)
BASOS ABS: 0 10*3/uL (ref 0.0–0.1)
BASOS PCT: 0 %
Basophils Relative: 0 %
EOS ABS: 0.1 10*3/uL (ref 0.0–0.7)
EOS PCT: 0 %
EOS PCT: 1 %
Eosinophils Absolute: 0 10*3/uL (ref 0.0–0.7)
HCT: 36.1 % (ref 36.0–46.0)
HEMATOCRIT: 36.4 % (ref 36.0–46.0)
Hemoglobin: 11.1 g/dL — ABNORMAL LOW (ref 12.0–15.0)
Hemoglobin: 11.2 g/dL — ABNORMAL LOW (ref 12.0–15.0)
Lymphocytes Relative: 21 %
Lymphocytes Relative: 8 %
Lymphs Abs: 0.6 10*3/uL — ABNORMAL LOW (ref 0.7–4.0)
Lymphs Abs: 1.5 10*3/uL (ref 0.7–4.0)
MCH: 25.1 pg — ABNORMAL LOW (ref 26.0–34.0)
MCH: 25.1 pg — ABNORMAL LOW (ref 26.0–34.0)
MCHC: 30.7 g/dL (ref 30.0–36.0)
MCHC: 30.8 g/dL (ref 30.0–36.0)
MCV: 81.4 fL (ref 78.0–100.0)
MCV: 81.7 fL (ref 78.0–100.0)
MONO ABS: 0.2 10*3/uL (ref 0.1–1.0)
Monocytes Absolute: 0.7 10*3/uL (ref 0.1–1.0)
Monocytes Relative: 11 %
Monocytes Relative: 2 %
NEUTROS ABS: 7.2 10*3/uL (ref 1.7–7.7)
Neutro Abs: 4.6 10*3/uL (ref 1.7–7.7)
Neutrophils Relative %: 67 %
Neutrophils Relative %: 90 %
PLATELETS: 403 10*3/uL — AB (ref 150–400)
Platelets: 387 10*3/uL (ref 150–400)
RBC: 4.42 MIL/uL (ref 3.87–5.11)
RBC: 4.47 MIL/uL (ref 3.87–5.11)
RDW: 15.5 % (ref 11.5–15.5)
RDW: 15.7 % — AB (ref 11.5–15.5)
WBC: 6.9 10*3/uL (ref 4.0–10.5)
WBC: 8 10*3/uL (ref 4.0–10.5)

## 2016-01-31 LAB — COMPREHENSIVE METABOLIC PANEL
ALBUMIN: 2.9 g/dL — AB (ref 3.5–5.0)
ALK PHOS: 77 U/L (ref 38–126)
ALT: 12 U/L — ABNORMAL LOW (ref 14–54)
ANION GAP: 10 (ref 5–15)
AST: 20 U/L (ref 15–41)
BILIRUBIN TOTAL: 0.3 mg/dL (ref 0.3–1.2)
BUN: 11 mg/dL (ref 6–20)
CALCIUM: 9.1 mg/dL (ref 8.9–10.3)
CO2: 25 mmol/L (ref 22–32)
Chloride: 100 mmol/L — ABNORMAL LOW (ref 101–111)
Creatinine, Ser: 0.89 mg/dL (ref 0.44–1.00)
GFR calc Af Amer: >60 mL/min (ref >60)
GFR calc non Af Amer: >60 mL/min (ref >60)
GLUCOSE: 186 mg/dL — AB (ref 65–99)
Potassium: 3.8 mmol/L (ref 3.5–5.1)
Sodium: 135 mmol/L (ref 135–145)
TOTAL PROTEIN: 7.8 g/dL (ref 6.5–8.1)

## 2016-01-31 LAB — BASIC METABOLIC PANEL
ANION GAP: 9 (ref 5–15)
BUN: 14 mg/dL (ref 6–20)
CALCIUM: 8.9 mg/dL (ref 8.9–10.3)
CO2: 26 mmol/L (ref 22–32)
Chloride: 101 mmol/L (ref 101–111)
Creatinine, Ser: 0.85 mg/dL (ref 0.44–1.00)
GFR calc Af Amer: >60 mL/min (ref >60)
Glucose, Bld: 131 mg/dL — ABNORMAL HIGH (ref 65–99)
POTASSIUM: 3.7 mmol/L (ref 3.5–5.1)
SODIUM: 136 mmol/L (ref 135–145)

## 2016-01-31 LAB — STREP PNEUMONIAE URINARY ANTIGEN: Strep Pneumo Urinary Antigen: NEGATIVE

## 2016-01-31 LAB — I-STAT CG4 LACTIC ACID, ED: LACTIC ACID, VENOUS: 1.81 mmol/L (ref 0.5–2.0)

## 2016-01-31 LAB — TROPONIN I: Troponin I: <0.03 ng/mL (ref <0.031)

## 2016-01-31 LAB — INFLUENZA PANEL BY PCR (TYPE A & B)
H1N1FLUPCR: NOT DETECTED
Influenza A By PCR: NEGATIVE
Influenza B By PCR: NEGATIVE

## 2016-01-31 LAB — HIV ANTIBODY (ROUTINE TESTING W REFLEX): HIV Screen 4th Generation wRfx: NONREACTIVE

## 2016-01-31 LAB — BRAIN NATRIURETIC PEPTIDE: B NATRIURETIC PEPTIDE 5: 9.9 pg/mL (ref 0.0–100.0)

## 2016-01-31 MED ORDER — ALBUTEROL SULFATE (2.5 MG/3ML) 0.083% IN NEBU
2.5000 mg | INHALATION_SOLUTION | RESPIRATORY_TRACT | Status: DC
  Administered 2016-01-31: 2.5 mg via RESPIRATORY_TRACT
  Filled 2016-01-31: qty 3

## 2016-01-31 MED ORDER — ALBUTEROL (5 MG/ML) CONTINUOUS INHALATION SOLN
10.0000 mg/h | INHALATION_SOLUTION | RESPIRATORY_TRACT | Status: AC
  Administered 2016-01-31: 10 mg/h via RESPIRATORY_TRACT

## 2016-01-31 MED ORDER — ALBUTEROL SULFATE (2.5 MG/3ML) 0.083% IN NEBU
5.0000 mg | INHALATION_SOLUTION | Freq: Once | RESPIRATORY_TRACT | Status: AC
  Administered 2016-01-31: 5 mg via RESPIRATORY_TRACT
  Filled 2016-01-31: qty 6

## 2016-01-31 MED ORDER — DEXTROSE 5 % IV SOLN
2.0000 g | INTRAVENOUS | Status: DC
Start: 2016-02-01 — End: 2016-02-05
  Administered 2016-02-01 – 2016-02-05 (×5): 2 g via INTRAVENOUS
  Filled 2016-01-31 (×6): qty 2

## 2016-01-31 MED ORDER — LEVALBUTEROL HCL 1.25 MG/0.5ML IN NEBU
1.2500 mg | INHALATION_SOLUTION | Freq: Three times a day (TID) | RESPIRATORY_TRACT | Status: DC
  Administered 2016-02-01 – 2016-02-05 (×13): 1.25 mg via RESPIRATORY_TRACT
  Filled 2016-01-31 (×14): qty 0.5

## 2016-01-31 MED ORDER — VANCOMYCIN HCL IN DEXTROSE 1-5 GM/200ML-% IV SOLN
1000.0000 mg | Freq: Once | INTRAVENOUS | Status: AC
  Administered 2016-01-31: 1000 mg via INTRAVENOUS
  Filled 2016-01-31: qty 200

## 2016-01-31 MED ORDER — LEVALBUTEROL HCL 0.63 MG/3ML IN NEBU: 0.6300 mg | INHALATION_SOLUTION | RESPIRATORY_TRACT | Status: DC | PRN

## 2016-01-31 MED ORDER — OXCARBAZEPINE 300 MG PO TABS
900.0000 mg | ORAL_TABLET | Freq: Three times a day (TID) | ORAL | Status: DC
  Filled 2016-01-31: qty 3

## 2016-01-31 MED ORDER — IPRATROPIUM-ALBUTEROL 0.5-2.5 (3) MG/3ML IN SOLN: 3.0000 mL | RESPIRATORY_TRACT | Status: DC

## 2016-01-31 MED ORDER — LEVALBUTEROL HCL 1.25 MG/0.5ML IN NEBU
1.2500 mg | INHALATION_SOLUTION | Freq: Four times a day (QID) | RESPIRATORY_TRACT | Status: DC
  Administered 2016-01-31: 1.25 mg via RESPIRATORY_TRACT
  Filled 2016-01-31: qty 0.5

## 2016-01-31 MED ORDER — PNEUMOCOCCAL VAC POLYVALENT 25 MCG/0.5ML IJ INJ
0.5000 mL | INJECTION | INTRAMUSCULAR | Status: AC
  Administered 2016-02-01: 0.5 mL via INTRAMUSCULAR
  Filled 2016-01-31: qty 0.5
  Filled 2016-01-31: qty 1

## 2016-01-31 MED ORDER — FUROSEMIDE 40 MG PO TABS
40.0000 mg | ORAL_TABLET | Freq: Every day | ORAL | Status: DC
  Administered 2016-01-31 – 2016-02-05 (×6): 40 mg via ORAL
  Filled 2016-01-31 (×6): qty 1

## 2016-01-31 MED ORDER — CEFTRIAXONE SODIUM 1 G IJ SOLR
1.0000 g | INTRAMUSCULAR | Status: DC
  Administered 2016-01-31: 1 g via INTRAVENOUS
  Filled 2016-01-31: qty 10

## 2016-01-31 MED ORDER — ONDANSETRON HCL 4 MG PO TABS: 4.0000 mg | ORAL_TABLET | Freq: Four times a day (QID) | ORAL | Status: DC | PRN

## 2016-01-31 MED ORDER — CETYLPYRIDINIUM CHLORIDE 0.05 % MT LIQD
7.0000 mL | Freq: Two times a day (BID) | OROMUCOSAL | Status: DC
  Administered 2016-01-31 – 2016-02-05 (×11): 7 mL via OROMUCOSAL

## 2016-01-31 MED ORDER — ALBUTEROL SULFATE (5 MG/ML) 0.5% IN NEBU: 2.5000 mg | INHALATION_SOLUTION | Freq: Four times a day (QID) | RESPIRATORY_TRACT | Status: DC

## 2016-01-31 MED ORDER — OXYCODONE-ACETAMINOPHEN 5-325 MG PO TABS
2.0000 | ORAL_TABLET | ORAL | Status: DC | PRN
Start: 2016-01-31 — End: 2016-02-05
  Administered 2016-02-01 – 2016-02-04 (×3): 2 via ORAL
  Filled 2016-01-31 (×3): qty 2

## 2016-01-31 MED ORDER — DEXTROSE 5 % IV SOLN
1.0000 g | Freq: Once | INTRAVENOUS | Status: AC
  Administered 2016-01-31: 1 g via INTRAVENOUS
  Filled 2016-01-31: qty 10

## 2016-01-31 MED ORDER — IPRATROPIUM BROMIDE 0.02 % IN SOLN
0.5000 mg | RESPIRATORY_TRACT | Status: DC
  Administered 2016-01-31: 0.5 mg via RESPIRATORY_TRACT
  Filled 2016-01-31: qty 2.5

## 2016-01-31 MED ORDER — ALBUTEROL (5 MG/ML) CONTINUOUS INHALATION SOLN
10.0000 mg/h | INHALATION_SOLUTION | Freq: Once | RESPIRATORY_TRACT | Status: AC
  Administered 2016-01-31: 10 mg/h via RESPIRATORY_TRACT
  Filled 2016-01-31: qty 20

## 2016-01-31 MED ORDER — ENOXAPARIN SODIUM 40 MG/0.4ML ~~LOC~~ SOLN
40.0000 mg | SUBCUTANEOUS | Status: DC
  Administered 2016-01-31 – 2016-02-01 (×2): 40 mg via SUBCUTANEOUS
  Filled 2016-01-31 (×2): qty 0.4

## 2016-01-31 MED ORDER — IPRATROPIUM-ALBUTEROL 0.5-2.5 (3) MG/3ML IN SOLN: 3.0000 mL | Freq: Four times a day (QID) | RESPIRATORY_TRACT | Status: DC

## 2016-01-31 MED ORDER — ACETAMINOPHEN 325 MG PO TABS: 650.0000 mg | ORAL_TABLET | Freq: Four times a day (QID) | ORAL | Status: DC | PRN

## 2016-01-31 MED ORDER — BUDESONIDE 0.25 MG/2ML IN SUSP
0.2500 mg | Freq: Two times a day (BID) | RESPIRATORY_TRACT | Status: DC
  Administered 2016-01-31 – 2016-02-05 (×11): 0.25 mg via RESPIRATORY_TRACT
  Filled 2016-01-31 (×11): qty 2

## 2016-01-31 MED ORDER — OXCARBAZEPINE 300 MG PO TABS
450.0000 mg | ORAL_TABLET | Freq: Three times a day (TID) | ORAL | Status: DC
  Administered 2016-01-31 – 2016-02-05 (×15): 450 mg via ORAL
  Filled 2016-01-31 (×22): qty 1

## 2016-01-31 MED ORDER — ALBUTEROL SULFATE (2.5 MG/3ML) 0.083% IN NEBU: 2.5000 mg | INHALATION_SOLUTION | RESPIRATORY_TRACT | Status: DC | PRN

## 2016-01-31 MED ORDER — DEXTROSE 5 % IV SOLN
500.0000 mg | INTRAVENOUS | Status: DC
  Administered 2016-02-01 – 2016-02-05 (×5): 500 mg via INTRAVENOUS
  Filled 2016-01-31 (×5): qty 500

## 2016-01-31 MED ORDER — POLYMYXIN B-TRIMETHOPRIM 10000-0.1 UNIT/ML-% OP SOLN
2.0000 [drp] | OPHTHALMIC | Status: DC
  Filled 2016-01-31: qty 10

## 2016-01-31 MED ORDER — LEVOFLOXACIN IN D5W 750 MG/150ML IV SOLN
750.0000 mg | Freq: Once | INTRAVENOUS | Status: AC
  Administered 2016-01-31: 750 mg via INTRAVENOUS
  Filled 2016-01-31: qty 150

## 2016-01-31 MED ORDER — METHYLPREDNISOLONE SODIUM SUCC 125 MG IJ SOLR
125.0000 mg | Freq: Once | INTRAMUSCULAR | Status: AC
Start: 2016-01-31 — End: 2016-01-31
  Administered 2016-01-31: 125 mg via INTRAVENOUS
  Filled 2016-01-31: qty 2

## 2016-01-31 MED ORDER — ACETAMINOPHEN 650 MG RE SUPP: 650.0000 mg | Freq: Four times a day (QID) | RECTAL | Status: DC | PRN

## 2016-01-31 MED ORDER — ALBUTEROL SULFATE (2.5 MG/3ML) 0.083% IN NEBU: 2.5000 mg | INHALATION_SOLUTION | Freq: Four times a day (QID) | RESPIRATORY_TRACT | Status: DC | PRN

## 2016-01-31 MED ORDER — ONDANSETRON HCL 4 MG/2ML IJ SOLN: 4.0000 mg | Freq: Four times a day (QID) | INTRAMUSCULAR | Status: DC | PRN

## 2016-01-31 MED ORDER — METHYLPREDNISOLONE SODIUM SUCC 125 MG IJ SOLR
60.0000 mg | Freq: Two times a day (BID) | INTRAMUSCULAR | Status: DC
  Administered 2016-01-31 – 2016-02-02 (×5): 60 mg via INTRAVENOUS
  Filled 2016-01-31 (×5): qty 2

## 2016-01-31 NOTE — Progress Notes (Signed)
[facility] PROGRESS NOTE  Vladimir Fasterudrey Velazco ZOX:[id number] DOB: 02/13/1971 DOA: 01/31/2016 PCP: No PCP Per Patient  Please refer to admission H&P from early this morning for details, 45 year old morbidly obese female admitted with acute bronchitis versus community acquired pneumonia and left lower leg cellulitis.  Assessment/Plan: Community acquired pneumonia versus acute bronchitis Empiric Rocephin and azithromycin. Follow blood cultures, sputum culture. Flu PCR negative. Check strep antigen and Legionella antigen.  Empiric Rocephin and azithromycin. Patient initially presented to the ED on 3/27 with similar symptoms. CT angiogram of the chest done was negative for PE and showed infiltrate. Patient had been discharged home on oral antibiotic but returned with persistent symptoms on 3/30.  Acute bronchitis Continue IV Solu Medrol, Pulmicort nebs. Stress albuterol nebs to Xopenex given tachycardia.  Sinus tachycardia Patient maintaining O2 sat on room air but gets tachycardic to 120s on minimal ambulation. Switched albuterol to Xopenex nebs.  Right lower extremity cellulitis Empiric vancomycin. Lower extremity Doppler on 3/27 negative for DVT.  Essential hypertension Lasix.  Tobacco abuse Counseled on cessation.  Trigeminal neuralgia Continue Trileptal  Code Status: Full code Family Communication: None at bedside Disposition Plan: Home once clinically improved possibly 48 hours.   Consultants:  None  Procedures:  None  Antibiotics:  IV vancomycin, Rocephin and azithromycin  HPI/Subjective: Seen and examined. Admission H&P reviewed. Patient gets tachycardic on minimal exertion. Still feels short of breath but slightly better since admission.  tachycardic to 120s on minimal exertion.  Objective: Filed Vitals:   01/31/16 0332 01/31/16 0445  BP: 124/72 144/80  Pulse: 101 110  Temp:  98.2 F (36.8 C)  Resp: 26 21    Intake/Output Summary (Last 24 hours) at  01/31/16 1252 Last data filed at 01/31/16 0936  Gross per 24 hour  Intake    290 ml  Output    400 ml  Net   -110 ml   Filed Weights   01/31/16 0004 01/31/16 0445  Weight: 140.615 kg (310 lb) 142 kg (313 lb 0.9 oz)    Exam:   General:  Morbidly obese female in some distress  HEENT: Mucosa, supple neck   Cardiovascular: S1 and S2 tachycardic  Respiratory: Diffuse wheezing bilaterally  Abdomen: Soft, nondistended, nontender  Musculoskeletal: Warm, no edema, erythema over right leg  Data Reviewed: Basic Metabolic Panel:  Recent Labs Lab 01/27/16 1220 01/31/16 0020 01/31/16 0615  NA 137 136 135  K 3.8 3.7 3.8  CL 101 101 100*  CO2 29 26 25   GLUCOSE 113* 131* 186*  BUN 10 14 11   CREATININE 0.81 0.85 0.89  CALCIUM 8.7* 8.9 9.1   Liver Function Tests:  Recent Labs Lab 01/27/16 1220 01/31/16 0615  AST 19 20  ALT 14 12*  ALKPHOS 79 77  BILITOT 0.2* 0.3  PROT 7.8 7.8  ALBUMIN 3.1* 2.9*   No results for input(s): LIPASE, AMYLASE in the last 168 hours. No results for input(s): AMMONIA in the last 168 hours. CBC:  Recent Labs Lab 01/27/16 1140 01/31/16 0020 01/31/16 0615  WBC 5.9 6.9 8.0  NEUTROABS 3.8 4.6 7.2  HGB 11.6* 11.1* 11.2*  HCT 37.3 36.1 36.4  MCV 81.8 81.7 81.4  PLT 427* 403* 387   Cardiac Enzymes:  Recent Labs Lab 01/27/16 1140 01/31/16 0020  TROPONINI <0.03 <0.03   BNP (last 3 results)  Recent Labs  01/27/16 1140 01/31/16 0020  BNP 6.8 9.9    ProBNP (last 3 results) No results for input(s): PROBNP in the last 8760 hours.  CBG: No results for input(s): GLUCAP in the last 168 hours.  No results found for this or any previous visit (from the past 240 hour(s)).   Studies: Dg Chest Port 1 View  01/31/2016  CLINICAL DATA:  45 year old female with shortness of breath EXAM: PORTABLE CHEST 1 VIEW COMPARISON:  Chest radiograph dated 01/27/2016 and CT dated 01/27/2016 FINDINGS: Single-view of the chest does not demonstrate a  focal consolidation. Linear and streaky density in the left mid lung field and lung bases likely subsegmental atelectatic changes. Pneumonia is not excluded. There is no focal consolidation or pneumothorax. A small amount of fluid is noted in the right minor fissure. The cardiac silhouette is within normal limits with no acute osseous pathology. IMPRESSION: No focal consolidation. Trace fluid in the right minor fissure. Electronically Signed   By: Elgie Collard M.D.   On: 01/31/2016 01:20    Scheduled Meds: . antiseptic oral rinse  7 mL Mouth Rinse BID  . [START ON 02/01/2016] azithromycin  500 mg Intravenous Q24H  . budesonide (PULMICORT) nebulizer solution  0.25 mg Nebulization BID  . cefTRIAXone (ROCEPHIN)  IV  1 g Intravenous Once  . [START ON 02/01/2016] cefTRIAXone (ROCEPHIN)  IV  2 g Intravenous Q24H  . enoxaparin (LOVENOX) injection  40 mg Subcutaneous Q24H  . furosemide  40 mg Oral Daily  . levalbuterol  1.25 mg Nebulization 4 times per day  . methylPREDNISolone (SOLU-MEDROL) injection  60 mg Intravenous Q12H  . OXcarbazepine  450 mg Oral TID  . [START ON 02/01/2016] pneumococcal 23 valent vaccine  0.5 mL Intramuscular Tomorrow-1000   Continuous Infusions:      Time spent: 15 minutes    Artha Stavros  [facility] Pager [phone]. If 7PM-7AM, please contact night-coverage at [email or web address], password [facility] 01/31/2016, 12:52 PM  LOS: 0 days

## 2016-01-31 NOTE — ED Notes (Signed)
Pt states dx with PNA here on Monday, states today breathing is worse with stuffing nose and productive cough

## 2016-01-31 NOTE — H&P (Signed)
[facility] History and Physical  [name] ZOX:[id number] DOB: September 15, 1971 DOA: 01/31/2016  Referring physician: Patient was transferred from [facility] [place]. PCP: No PCP Per Patient primary care [place]. Specialists: Dr. Tenny Craw neurologist.  Chief Complaint: Shortness of breath.  HPI: Megan Barrett is a 45 y.o. female with history of asthma, previous history of tobacco abuse, morbid obesity, trigeminal neuralgia presents to the ER because of increasing shortness of breath. Patient had originally come to the ER 3 days ago with right lower extremity erythema and swelling. At that time patient was found to be short of breath and CT angiogram was done which showed bilateral pneumonia and patient was discharged home on antibiotics for pneumonia and cellulitis. Despite taking which patient was short of breath and at this time patient is found to be wheezing. Chest x-ray does not show any infiltrates. Patient denies any chest pain does have a productive cough. On exam patient also has right lower extremity erythema specifically at the place where patient had a tattoo done.   Review of Systems: As presented in the history of presenting illness, rest negative.  Past Medical History  Diagnosis Date  . Hypertension   . Asthma   . Gestational diabetes   . Trigeminal neuralgia   . Community acquired pneumonia    Past Surgical History  Procedure Laterality Date  . Laparoscopic tubal ligation     Social History:  reports that she quit smoking about 5 months ago. Her smoking use included Cigarettes. She has never used smokeless tobacco. She reports that she drinks about 1.2 oz of alcohol per week. She reports that she does not use illicit drugs. Where does patient live Home. Can patient participate in ADLs? Yes.  No Known Allergies  Family History:  Family History  Problem Relation Age of Onset  . Hypertension Mother   . Hypertension Father   . Diabetes Mellitus II Father      Prior to Admission medications   Medication Sig Start Date End Date Taking? Authorizing Provider  albuterol (PROVENTIL HFA;VENTOLIN HFA) 108 (90 BASE) MCG/ACT inhaler Inhale 2 puffs into the lungs daily as needed.      Historical Provider, MD  doxycycline (VIBRAMYCIN) 100 MG capsule Take 2 capsules (200 mg total) by mouth 2 (two) times daily. 01/27/16   Chase Picket Ward, PA-C  furosemide (LASIX) 40 MG tablet Take 40 mg by mouth daily.      Historical Provider, MD  HYDROcodone-acetaminophen (NORCO/VICODIN) 5-325 MG tablet Take 1 tablet by mouth every 6 (six) hours as needed for severe pain. 01/27/16   Jaime Pilcher Ward, PA-C  ipratropium-albuterol (DUONEB) 0.5-2.5 (3) MG/3ML SOLN Take 3 mLs by nebulization.    Historical Provider, MD  Oxcarbazepine (TRILEPTAL) 300 MG tablet Take 900 mg by mouth 3 (three) times daily.      Historical Provider, MD  oxyCODONE-acetaminophen (PERCOCET) 5-325 MG per tablet Take 2 tablets by mouth every 4 (four) hours as needed. 02/25/14   Rolan Bucco, MD    Physical Exam: Filed Vitals:   01/31/16 8119 01/31/16 0330 01/31/16 0332 01/31/16 0445  BP:   124/72 144/80  Pulse: 101 100 101 110  Temp:    98.2 F (36.8 C)  TempSrc:    Oral  Resp: Height:     (1.549 m)  Weight:    313 lb 0.9 oz (142 kg)  SpO2: 94% 93% 93% 95%     General:  Obese not in distress.  Eyes:  Anicteric no pallor.  ENT: No discharge from the ears eyes nose and mouth.  Neck: No mass felt. No JVD appreciated.  Cardiovascular: S1-S2 heard.  Respiratory: Bilateral expiratory wheeze and no crepitations.  Abdomen: Soft nontender bowel sounds present.  Skin: No rash.  Musculoskeletal: No edema.  Psychiatric: Appears normal.  Neurologic: Alert awake oriented to time place and person. Moves all extremities.  Labs on Admission:  Basic Metabolic Panel:  Recent Labs Lab 01/27/16 1220 01/31/16 0020  NA 137 136  K 3.8 3.7  CL 101 101  CO2 29 26  GLUCOSE  113* 131*  BUN 10 14  CREATININE 0.81 0.85  CALCIUM 8.7* 8.9   Liver Function Tests:  Recent Labs Lab 01/27/16 1220  AST 19  ALT 14  ALKPHOS 79  BILITOT 0.2*  PROT 7.8  ALBUMIN 3.1*   No results for input(s): LIPASE, AMYLASE in the last 168 hours. No results for input(s): AMMONIA in the last 168 hours. CBC:  Recent Labs Lab 01/27/16 1140 01/31/16 0020  WBC 5.9 6.9  NEUTROABS 3.8 4.6  HGB 11.6* 11.1*  HCT 37.3 36.1  MCV 81.8 81.7  PLT 427* 403*   Cardiac Enzymes:  Recent Labs Lab 01/27/16 1140 01/31/16 0020  TROPONINI <0.03 <0.03    BNP (last 3 results)  Recent Labs  01/27/16 1140 01/31/16 0020  BNP 6.8 9.9    ProBNP (last 3 results) No results for input(s): PROBNP in the last 8760 hours.  CBG: No results for input(s): GLUCAP in the last 168 hours.  Radiological Exams on Admission: Dg Chest Port 1 View  01/31/2016  CLINICAL DATA:  45 year old female with shortness of breath EXAM: PORTABLE CHEST 1 VIEW COMPARISON:  Chest radiograph dated 01/27/2016 and CT dated 01/27/2016 FINDINGS: Single-view of the chest does not demonstrate a focal consolidation. Linear and streaky density in the left mid lung field and lung bases likely subsegmental atelectatic changes. Pneumonia is not excluded. There is no focal consolidation or pneumothorax. A small amount of fluid is noted in the right minor fissure. The cardiac silhouette is within normal limits with no acute osseous pathology. IMPRESSION: No focal consolidation. Trace fluid in the right minor fissure. Electronically Signed   By: Elgie CollardArash  Radparvar M.D.   On: 01/31/2016 01:20    EKG: Independently reviewed. Sinus tachycardia.  Assessment/Plan Principal Problem:   CAP (community acquired pneumonia) Active Problems:   NEURALGIA, TRIGEMINAL   Acute bronchitis   Cellulitis of right lower extremity   1. Community-acquired pneumonia - patient has been placed on ceftriaxone and Zithromax. Follow blood cultures  sputum cultures influenza PCR urine for strep and legionella antigen. Check HIV status. 2. Acute bronchitis - patient is placed on Solu-Medrol nebulizer and Pulmicort. 3. Right lower extremity cellulitis - patient is on empiric antibiotics. Patient's Dopplers done were negative for DVT recently. Closely observe. 4. History of trigeminal neuralgia on Trileptal. 5. History of hypertension presently on Lasix which will be continued. 6. Mild anemia - follow CBC.   DVT Prophylaxis Lovenox.  Code Status: Full code.  Family Communication: Discussed with patient.  Disposition Plan: Admit to inpatient.    Chad Donoghue N. [facility] Pager [phone].  If 7PM-7AM, please contact night-coverage [email or web address] Password [email or web address] 01/31/2016, 5:50 AM

## 2016-01-31 NOTE — Progress Notes (Signed)
Patient admitted to room [phone]. Dr. Toniann FailKakrakandy notified by [facility].

## 2016-01-31 NOTE — Progress Notes (Signed)
Utilization review completed.  L. J. Vena Bassinger RN, BSN, CM 

## 2016-01-31 NOTE — ED Notes (Signed)
Pt placed on auto vitals Q30. Patient placed on cardiac monitor.  

## 2016-01-31 NOTE — ED Provider Notes (Signed)
TIME SEEN: 12:20 AM  CHIEF COMPLAINT: Shortness of breath, cough  HPI: Pt is a 45 y.o. female with history of hypertension, morbid obesity, chronic bronchitis who presents to the emergency department with one month of cough and shortness of breath. Was seen in the emergency department on March 27 and diagnosed with bilateral pneumonia seen on CT scan. At that time no large pulmonary embolus was seen. Also found to have right lower [place] he cellulitis around an area of a recent tattoo and therefore was placed on doxycycline to help with her pneumonia as well as her cellulitis. Reports that her shortness of breath has come progressively worse over the past several days. She has had bilateral lower extreme swelling for the past 2 weeks. Did have an influenza vaccination this year. No known fevers or chills. Feels that the redness to her right leg is slightly improving. No chest pain or chest discomfort. Her PCP is her OB/GYN. No known history of heart failure.  ROS: See HPI Constitutional: no fever  Eyes: no drainage  ENT:  runny nose   Cardiovascular:  no chest pain  Resp:  SOB  GI: no vomiting GU: no dysuria Integumentary: no rash  Allergy: no hives  Musculoskeletal: no leg swelling  Neurological: no slurred speech ROS otherwise negative  PAST MEDICAL HISTORY/PAST SURGICAL HISTORY:  Past Medical History  Diagnosis Date  . Hypertension   . Asthma   . Gestational diabetes   . Trigeminal neuralgia   . Community acquired pneumonia     MEDICATIONS:  Prior to Admission medications   Medication Sig Start Date End Date Taking? Authorizing Provider  albuterol (PROVENTIL HFA;VENTOLIN HFA) 108 (90 BASE) MCG/ACT inhaler Inhale 2 puffs into the lungs daily as needed.      Historical Provider, MD  doxycycline (VIBRAMYCIN) 100 MG capsule Take 2 capsules (200 mg total) by mouth 2 (two) times daily. 01/27/16   Chase PicketJaime Pilcher Najah Liverman, PA-C  furosemide (LASIX) 40 MG tablet Take 40 mg by mouth daily.       Historical Provider, MD  HYDROcodone-acetaminophen (NORCO/VICODIN) 5-325 MG tablet Take 1 tablet by mouth every 6 (six) hours as needed for severe pain. 01/27/16   Jaime Pilcher Zeyad Delaguila, PA-C  ipratropium-albuterol (DUONEB) 0.5-2.5 (3) MG/3ML SOLN Take 3 mLs by nebulization.    Historical Provider, MD  Oxcarbazepine (TRILEPTAL) 300 MG tablet Take 900 mg by mouth 3 (three) times daily.      Historical Provider, MD  oxyCODONE-acetaminophen (PERCOCET) 5-325 MG per tablet Take 2 tablets by mouth every 4 (four) hours as needed. 02/25/14   Rolan BuccoMelanie Belfi, MD    ALLERGIES:  No Known Allergies  SOCIAL HISTORY:  Social History  Substance Use Topics  . Smoking status: Former Smoker    Types: Cigarettes    Quit date: 08/03/2015  . Smokeless tobacco: Never Used  . Alcohol Use: 1.2 oz/week    1 Glasses of wine, 1 Cans of beer per week    FAMILY HISTORY: No family history on file.  EXAM: BP 124/70 mmHg  Pulse 112  Temp(Src) 98.2 F (36.8 C) (Oral)  Resp 22  Ht 5\' 1"  (1.549 m)  Wt 310 lb (140.615 kg)  BMI 58.60 kg/m2  SpO2 96%  LMP 01/13/2016 (Approximate) CONSTITUTIONAL: Alert and oriented and responds appropriately to questions. Morbidly obese, appears uncomfortable, in mild respiratory distress HEAD: Normocephalic EYES: Conjunctivae injected bilaterally with purulent drainage noted on her lashes bilaterally, pupils are equal and reactive to light bilaterally, normal visual fields, extraocular movements intact  ENT: normal nose; no rhinorrhea; moist mucous membranes NECK: Supple, no meningismus, no LAD  CARD: Regular and tachycardic; S1 and S2 appreciated; no murmurs, no clicks, no rubs, no gallops RESP: Patient is tachypneic, diffuse expiratory wheezes, upper airway transmitted noises that are rhonchorous, no hypoxia, mild respiratory distress, speaking short sentences ABD/GI: Normal bowel sounds; non-distended; soft, non-tender, no rebound, no guarding, no peritoneal signs BACK:  The back  appears normal and is non-tender to palpation, there is no CVA tenderness EXT: Normal ROM in all joints; non-tender to palpation; bilateral 2+ pitting edema to the level of the knees; normal capillary refill; no cyanosis, no calf tenderness or swelling; patient has a tattoo to the right lateral calf with surrounding erythema and no purulent drainage, no fluctuance    SKIN: Normal color for age and race; warm; no rash NEURO: Moves all extremities equally, sensation to light touch intact diffusely, cranial nerves II through XII intact PSYCH: The patient's mood and manner are appropriate. Grooming and personal hygiene are appropriate.  MEDICAL DECISION MAKING: Patient here with increasing shortness of breath, wheezing in the setting of recent diagnosis of bilateral pneumonia. She is in mild respiratory distress. He is tachycardic but not hypoxic. She is tachypneic and speaking short sentences. We'll give albuterol, Solu-Medrol given her history of chronic bronchitis. She was previously a smoker but quit 4 months ago. No diagnosis of asthma or COPD. States she is supposed to follow-up with a pulmonologist as an outpatient. Patient also has cellulitis of the right leg but she states is slightly improving on doxycycline. No abscess. She did have a venous Doppler of this leg on March 27 that was negative for DVT but did show reactive lymph nodes. We'll give her vancomycin for her cellulitis. We'll give her IV Levaquin for her pneumonia. She had a CT of her chest that ruled out large pulmonary embolus on March 27. Rectal temperature here is 98.3. We'll obtain labs, troponin and BNP, cultures, repeat chest x-ray. I feel patient will need admission to the hospital as it appears she is failing outpatient treatment.  ED PROGRESS: 1:50 AM  Pt has better aeration but still having diffuse expiratory wheezing. I feel she will need admission to the hospital for IV antibiotics, continued breathing treatments and IV steroids.  She is comfortable with this plan and comfortable with [facility] a [facility]. We'll discuss with hospitalist on call. Suspect the patient has underlying COPD that has yet to be diagnosed.  Labs are unremarkable. BNP, troponin normal. Lactate normal. Chest x-ray shows no large consolidation, edema, pneumothorax.  2:15 AM  Discussed patient's case with hospitalist, Dr. Toniann Fail.  Recommend admission to inpatient, telemetry bed.  I will place holding orders per their request. Patient and family (if present) updated with plan. Care transferred to hospitalist service.  We'll transfer to [facility] when bed is available. She is still receiving continuous treatment.   I reviewed all nursing notes, vitals, pertinent old records, labs, imaging (as available).     EKG Interpretation  Date/Time:  Friday January 31 2016 01:52:43 EDT Ventricular Rate:  104 PR Interval:  149 QRS Duration: 94 QT Interval:  365 QTC Calculation: 480 R Axis:   57 Text Interpretation:  Sinus tachycardia No significant change since last tracing other than rate is faster Confirmed by Annamaria Salah,  DO, Luqman Perrelli ([id number]) on 01/31/2016 1:57:42 AM          Layla Maw Cj Edgell, DO 01/31/16 [id number]

## 2016-01-31 NOTE — ED Notes (Signed)
Pt transported via [facility] condition stable

## 2016-01-31 NOTE — Progress Notes (Addendum)
While ambulating to bathroom patient's HR 120-130 with dyspnea with exertion. Patient back in and HR 110-120.

## 2016-01-31 NOTE — Plan of Care (Signed)
45 year old female had come to the ER 3 days ago with shortness of breath and CT angiogram done at that time showed pneumonia and was discharged home on doxycycline despite taking the patient is still short of breath and at this time patient also found to be wheezing and was placed on Solu-Medrol nebulizer and started on Levaquin and admitted for acute bronchitis with pneumonia.  Megan Barrett.

## 2016-02-01 DIAGNOSIS — J189 Pneumonia, unspecified organism: Principal | ICD-10-CM

## 2016-02-01 DIAGNOSIS — L03115 Cellulitis of right lower limb: Secondary | ICD-10-CM

## 2016-02-01 DIAGNOSIS — G500 Trigeminal neuralgia: Secondary | ICD-10-CM

## 2016-02-01 DIAGNOSIS — J209 Acute bronchitis, unspecified: Secondary | ICD-10-CM

## 2016-02-01 LAB — EXPECTORATED SPUTUM ASSESSMENT W GRAM STAIN, RFLX TO RESP C

## 2016-02-01 LAB — EXPECTORATED SPUTUM ASSESSMENT W REFEX TO RESP CULTURE

## 2016-02-01 MED ORDER — WHITE PETROLATUM GEL
Status: AC
  Administered 2016-02-01: 12:00:00
  Filled 2016-02-01: qty 1

## 2016-02-01 MED ORDER — ENOXAPARIN SODIUM 80 MG/0.8ML ~~LOC~~ SOLN
70.0000 mg | SUBCUTANEOUS | Status: DC
  Administered 2016-02-02 – 2016-02-05 (×4): 70 mg via SUBCUTANEOUS
  Filled 2016-02-01 (×4): qty 0.8

## 2016-02-01 NOTE — Progress Notes (Signed)
Progress Note   Megan Fasterudrey Pry JYN:[id number] DOB: 12/09/1970 DOA: 01/31/2016 PCP: No PCP Per Patient   Brief Narrative:   Megan Barrett is an 45 y.o. female with PMH of morbid obesity admitted 01/31/16 with acute bronchitis versus community-acquired pneumonia as well as left lower leg cellulitis.  Assessment/Plan:   Principal Problem:   CAP (community acquired pneumonia) With acute bronchitis CT from 01/27/16 did show patchy infiltrates in the right upper lobe and left lower lobe. Continue empiric Rocephin/azithromycin. Follow-up blood cultures and sputum culture. Influenza PCR was negative. HIV nonreactive. Strep pneumonia urinary antigen was negative. Legionella pending. Continue Solu-Medrol, bronchodilators and Pulmicort nebs.  Active Problems:   NEURALGIA, TRIGEMINAL Continue Trileptal.  Sinus tachycardia Patient maintaining O2 sat on room air but gets tachycardic to 120s on minimal ambulation. Switched albuterol to Xopenex nebs.  Right lower extremity cellulitis versus inflammation Was given one dose of vancomycin on admission, but this does not appear to be ordered routinely. Lower extremity Doppler on 01/27/16 negative for DVT. Focal area of erythema surrounding newly inked tattoo, monitor for signs of progression, but suspect this is inflammatory rather than infection.  Essential hypertension Continue Lasix.  Tobacco abuse Counseled on cessation.    DVT Prophylaxis Lovenox ordered.   Family Communication/Anticipated D/C date and plan/Code Status   Family Communication: No family at the bedside. Disposition Plan/date: Home when stable, likely 1-2 days depending on improvement of respiratory symptoms. Code Status: Full code.   Procedures and diagnostic studies:   Dg Chest 2 View  01/27/2016  CLINICAL DATA:  Shortness of breath and lower extremity edema EXAM: CHEST  2 VIEW COMPARISON:  December 31, 2015 FINDINGS: There are areas of scarring in both mid lung  regions as well as in the left base. There is no edema or consolidation. Heart size is upper normal with pulmonary vascularity within normal limits. No adenopathy. There is degenerative change in the thoracic spine. IMPRESSION: Stable scarring bilaterally. No edema or consolidation. No change in cardiac silhouette. Electronically Signed   By: Bretta BangWilliam  Woodruff III M.D.   On: 01/27/2016 11:31   Ct Angio Chest Pe W/cm &/or Wo Cm  01/27/2016  CLINICAL DATA:  Shortness of breath and chest pain for several days EXAM: CT ANGIOGRAPHY CHEST WITH CONTRAST TECHNIQUE: Multidetector CT imaging of the chest was performed using the standard protocol during bolus administration of intravenous contrast. Multiplanar CT image reconstructions and MIPs were obtained to evaluate the vascular anatomy. CONTRAST:  100mL OMNIPAQUE IOHEXOL 350 MG/ML SOLN COMPARISON:  Chest radiograph January 27, 2016 FINDINGS: Mediastinum/Lymph Nodes: The bolus is less than optimal for evaluation for pulmonary embolus. No major vessel pulmonary embolus is seen. To the extent that the peripheral pulmonary arteries can't be evaluated, no pulmonary embolus is evident. There is no thoracic aortic aneurysm or dissection. The great vessels which are visualized appear normal. The right common and left common carotid arteries arise as a common trunk, an anatomic variant. The pericardium is not thickened. There is left ventricular hypertrophy. Visualized thyroid appears normal. There are multiple small mediastinal lymph nodes. There is a lymph node to the left of the carina measuring 1.6 x 1.4 cm. There are several mildly prominent sub- carinal lymph nodes, largest measuring 1.7 x 1.2 cm. There is a pretracheal lymph node measuring 1.3 x 0.9 cm. Lungs/Pleura: There are patchy areas of airspace consolidation in the lateral and posterior segments of the left lower lobe and in the right upper lobe posterior segment near the major  fissure. Upper abdomen: Visualized  upper abdominal structures are unremarkable. Musculoskeletal: There is degenerative change in the thoracic spine. There are no blastic or lytic bone lesions. Review of the MIP images confirms the above findings. IMPRESSION: Less than optimal contrast bolus of the pulmonary arteries. No demonstrable pulmonary embolus to the extent that the pulmonary arteries can be evaluated. Areas of patchy infiltrate, most likely pneumonia in the posterior segment right upper lobe and in the lateral and posterior segments of the left lower lobe. Several mildly prominent lymph nodes, possibly of reactive etiology. Left ventricular hypertrophy is present. Electronically Signed   By: Bretta Bang III M.D.   On: 01/27/2016 13:33   US Venous Img Lower Unilateral Right  01/27/2016  CLINICAL DATA:  Right calf edema around new tattoo x 3 days. Bilateral leg swelling, shortness breath x3 months, previous tobacco abuse EXAM: RIGHT LOWER EXTREMITY VENOUS DOPPLER ULTRASOUND TECHNIQUE: Gray-scale sonography with compression, as well as color and duplex ultrasound, were performed to evaluate the deep venous system from the level of the common femoral vein through the popliteal and proximal calf veins. COMPARISON:  None FINDINGS: Normal compressibility of the common femoral, superficial femoral, and popliteal veins, as well as the proximal calf veins. No filling defects to suggest DVT on grayscale or color Doppler imaging. Doppler waveforms show normal direction of venous flow, normal respiratory phasicity and response to augmentation. Visualized segments of the saphenous venous system normal in caliber and compressibility. Prominent right inguinal lymph nodes measuring up to 17 mm short axis diameter. Survey views of the contralateral common femoral vein are unremarkable. IMPRESSION: 1. No evidence of lower extremity deep vein thrombosis, right. 2. Enlarged right inguinal lymph nodes, possibly reactive but nonspecific. Electronically  Signed   By: Corlis Leak M.D.   On: 01/27/2016 16:04   Dg Chest Port 1 View  01/31/2016  CLINICAL DATA:  45 year old female with shortness of breath EXAM: PORTABLE CHEST 1 VIEW COMPARISON:  Chest radiograph dated 01/27/2016 and CT dated 01/27/2016 FINDINGS: Single-view of the chest does not demonstrate a focal consolidation. Linear and streaky density in the left mid lung field and lung bases likely subsegmental atelectatic changes. Pneumonia is not excluded. There is no focal consolidation or pneumothorax. A small amount of fluid is noted in the right minor fissure. The cardiac silhouette is within normal limits with no acute osseous pathology. IMPRESSION: No focal consolidation. Trace fluid in the right minor fissure. Electronically Signed   By: Elgie Collard M.D.   On: 01/31/2016 01:20    Medical Consultants:    None.  Anti-Infectives:   Azithromycin 01/31/16---> Rocephin 01/31/16---> Vancomycin 01/21/16---> 01/31/16 Levaquin 01/31/16---> 01/31/16  Subjective:    Marylan Glore reports that her dyspnea has improved. No significant sputum production. No nausea or vomiting. Lower extremity swelling has improved some.  Objective:    Filed Vitals:   02/01/16 0505 02/01/16 0508 02/01/16 0758 02/01/16 0801  BP:  106/60    Pulse:  96    Temp:  97.6 F (36.4 C)    TempSrc:  Oral    Resp:  20    Height:      Weight: 142 kg (313 lb 0.9 oz)     SpO2:  98% 97% 97%    Intake/Output Summary (Last 24 hours) at 02/01/16 0939 Last data filed at 02/01/16 0511  Gross per 24 hour  Intake    340 ml  Output   1600 ml  Net  -1260 ml   [employer]  01/31/16 0004 01/31/16 0445 02/01/16 0505  Weight: 140.615 kg (310 lb) 142 kg (313 lb 0.9 oz) 142 kg (313 lb 0.9 oz)    Exam: Gen:  NAD Cardiovascular:  Tachycardic, No M/R/G Respiratory:  Lungs with bilateral wheezes Gastrointestinal:  Abdomen soft, NT/ND, + BS Extremities:  2+ pedal edema, tattoo right lower leg with erythema along the  inked margins   Data Reviewed:    Labs: Basic Metabolic Panel:  Recent Labs Lab 01/27/16 1220 01/31/16 0020 01/31/16 0615  NA 137 136 135  K 3.8 3.7 3.8  CL 101 101 100*  CO2 GLUCOSE 113* 131* 186*  BUN CREATININE 0.81 0.85 0.89  CALCIUM 8.7* 8.9 9.1   GFR Estimated Creatinine Clearance: 108.9 mL/min (by C-G formula based on Cr of 0.89). Liver Function Tests:  Recent Labs Lab 01/27/16 1220 01/31/16 0615  AST 19 20  ALT 14 12*  ALKPHOS 79 77  BILITOT 0.2* 0.3  PROT 7.8 7.8  ALBUMIN 3.1* 2.9*   CBC:  Recent Labs Lab 01/27/16 1140 01/31/16 0020 01/31/16 0615  WBC 5.9 6.9 8.0  NEUTROABS 3.8 4.6 7.2  HGB 11.6* 11.1* 11.2*  HCT 37.3 36.1 36.4  MCV 81.8 81.7 81.4  PLT 427* 403* 387   Cardiac Enzymes:  Recent Labs Lab 01/27/16 1140 01/31/16 0020  TROPONINI <0.03 <0.03   Sepsis Labs:  Recent Labs Lab 01/27/16 1140 01/31/16 0020 01/31/16 0058 01/31/16 0615  WBC 5.9 6.9  --  8.0  LATICACIDVEN  --   --  1.81  --    Microbiology Recent Results (from the past 240 hour(s))  Culture, sputum-assessment     Status: None   Collection Time: 02/01/16  6:49 AM  Result Value Ref Range Status   Specimen Description EXPECTORATED SPUTUM  Final   Special Requests NONE  Final   Sputum evaluation   Final    THIS SPECIMEN IS ACCEPTABLE. RESPIRATORY CULTURE REPORT TO FOLLOW.   Report Status 02/01/2016 FINAL  Final     Medications:   . antiseptic oral rinse  7 mL Mouth Rinse BID  . azithromycin  500 mg Intravenous Q24H  . budesonide (PULMICORT) nebulizer solution  0.25 mg Nebulization BID  . cefTRIAXone (ROCEPHIN)  IV  2 g Intravenous Q24H  . enoxaparin (LOVENOX) injection  40 mg Subcutaneous Q24H  . furosemide  40 mg Oral Daily  . levalbuterol  1.25 mg Nebulization TID  . methylPREDNISolone (SOLU-MEDROL) injection  60 mg Intravenous Q12H  . OXcarbazepine  450 mg Oral TID  . pneumococcal 23 valent vaccine  0.5 mL Intramuscular  Tomorrow-1000   Continuous Infusions:   Time spent: 25 minutes.   LOS: 1 day   Ronald Vinsant  [facility] Pager [phone]. If unable to reach me by pager, please call my cell phone at [phone].  *Please refer to [email or web address], password [email or web address] to get updated schedule on who will round on this patient, as hospitalists switch teams weekly. If 7PM-7AM, please contact night-coverage at [email or web address], password [email or web address] for any overnight needs.  02/01/2016, 9:39 AM

## 2016-02-01 NOTE — Progress Notes (Signed)
Pharmacy: Lovenox Dose Adjustment for VTE Prophylaxis  OBJECTIVE:  Wt: 142 kg, Ht: 61 inches, BMI ~59 SCr 0.89, CrCl~80-100 ml/min  ASSESSMENT:  [age] YOF who presented on 3/31 with SOB and is on antibiotics for CAP. Noted morbid obesity - requiring a lovenox dose adjustment for VTE prophylaxis in the setting of BMI>30 and CrCl>30 ml/mn  PLAN:  1. Adjust Lovenox to 70 mg (0.5 mg/kg) SQ every 24 hours 2. Pharmacy will monitor peripherally for any other necessary dose adjustments  Georgina PillionElizabeth Raymon Schlarb, PharmD, BCPS Clinical Pharmacist Pager: [phone] 02/01/2016 1:51 PM

## 2016-02-02 ENCOUNTER — Encounter (HOSPITAL_COMMUNITY): Payer: Self-pay | Admitting: Internal Medicine

## 2016-02-02 DIAGNOSIS — R6510 Systemic inflammatory response syndrome (SIRS) of non-infectious origin without acute organ dysfunction: Secondary | ICD-10-CM | POA: Diagnosis present

## 2016-02-02 DIAGNOSIS — Z6841 Body Mass Index (BMI) 40.0 and over, adult: Secondary | ICD-10-CM

## 2016-02-02 HISTORY — DX: Morbid (severe) obesity due to excess calories: E66.01

## 2016-02-02 LAB — CBC
HCT: 38 % (ref 36.0–46.0)
HEMOGLOBIN: 11.2 g/dL — AB (ref 12.0–15.0)
MCH: 24.5 pg — ABNORMAL LOW (ref 26.0–34.0)
MCHC: 29.5 g/dL — AB (ref 30.0–36.0)
MCV: 83 fL (ref 78.0–100.0)
PLATELETS: 418 10*3/uL — AB (ref 150–400)
RBC: 4.58 MIL/uL (ref 3.87–5.11)
RDW: 15.8 % — AB (ref 11.5–15.5)
WBC: 13.7 10*3/uL — ABNORMAL HIGH (ref 4.0–10.5)

## 2016-02-02 LAB — BASIC METABOLIC PANEL
Anion gap: 9 (ref 5–15)
BUN: 17 mg/dL (ref 6–20)
CALCIUM: 9.4 mg/dL (ref 8.9–10.3)
CO2: 27 mmol/L (ref 22–32)
CREATININE: 0.76 mg/dL (ref 0.44–1.00)
Chloride: 100 mmol/L — ABNORMAL LOW (ref 101–111)
Glucose, Bld: 162 mg/dL — ABNORMAL HIGH (ref 65–99)
Potassium: 4.5 mmol/L (ref 3.5–5.1)
SODIUM: 136 mmol/L (ref 135–145)

## 2016-02-02 MED ORDER — METHYLPREDNISOLONE SODIUM SUCC 125 MG IJ SOLR
60.0000 mg | INTRAMUSCULAR | Status: DC
Start: 2016-02-03 — End: 2016-02-05
  Administered 2016-02-03 – 2016-02-05 (×3): 60 mg via INTRAVENOUS
  Filled 2016-02-02 (×3): qty 2

## 2016-02-02 NOTE — Progress Notes (Signed)
Pt complain of pain at the iv site, iv line removed pt still has to have her rocephin iv team paged, vicky the nurse that took over from me is made aware to continue the iv abx when iv team set up another iv line

## 2016-02-02 NOTE — Progress Notes (Signed)
Progress Note   Ghalia Reicks SFK:[id number] DOB: 04-12-1971 DOA: 01/31/2016 PCP: No PCP Per Patient   Brief Narrative:   Rosalene Wardrop is an 45 y.o. female with PMH of morbid obesity admitted 01/31/16 with acute bronchitis versus community-acquired pneumonia as well as left lower leg cellulitis.  Assessment/Plan:   Principal Problem:   CAP (community acquired pneumonia) With acute bronchitis/SIRS CT from 01/27/16 did show patchy infiltrates in the right upper lobe and left lower lobe. SIRS criteria met but no evidence of end organ damage suggestive of sepsis.  Continue empiric Rocephin/azithromycin. Follow-up blood cultures and sputum culture. Influenza PCR was negative. HIV nonreactive. Strep pneumonia urinary antigen was negative. Legionella pending. Continue Solu-Medrol, bronchodilators and Pulmicort nebs.  Active Problems:   Morbid obesity BMI 59.3 Dietitian consultation for diet education.    NEURALGIA, TRIGEMINAL Continue Trileptal.  Sinus tachycardia Continue Xopenex for treatment of bronchospasm. Heart rate reasonable.  Right lower extremity cellulitis versus inflammation Was given one dose of vancomycin on admission, but this does not appear to be ordered routinely. Lower extremity Doppler on 01/27/16 negative for DVT. Focal area of erythema surrounding newly inked tattoo, monitor for signs of progression, but suspect this is inflammatory rather than infection.  Essential hypertension Continue Lasix.  Tobacco abuse Counseled on cessation.    DVT Prophylaxis Lovenox ordered.   Family Communication/Anticipated D/C date and plan/Code Status   Family Communication: No family at the bedside. Disposition Plan/date: Home when stable, Possibly tomorrow depending on improvement of respiratory symptoms. Code Status: Full code.   Procedures and diagnostic studies:   Dg Chest 2 View  01/27/2016  CLINICAL DATA:  Shortness of breath and lower extremity edema EXAM:  CHEST  2 VIEW COMPARISON:  December 31, 2015 FINDINGS: There are areas of scarring in both mid lung regions as well as in the left base. There is no edema or consolidation. Heart size is upper normal with pulmonary vascularity within normal limits. No adenopathy. There is degenerative change in the thoracic spine. IMPRESSION: Stable scarring bilaterally. No edema or consolidation. No change in cardiac silhouette. Electronically Signed   By: Lowella Grip III M.D.   On: 01/27/2016 11:31   Ct Angio Chest Pe W/cm &/or Wo Cm  01/27/2016  CLINICAL DATA:  Shortness of breath and chest pain for several days EXAM: CT ANGIOGRAPHY CHEST WITH CONTRAST TECHNIQUE: Multidetector CT imaging of the chest was performed using the standard protocol during bolus administration of intravenous contrast. Multiplanar CT image reconstructions and MIPs were obtained to evaluate the vascular anatomy. CONTRAST:  170m OMNIPAQUE IOHEXOL 350 MG/ML SOLN COMPARISON:  Chest radiograph January 27, 2016 FINDINGS: Mediastinum/Lymph Nodes: The bolus is less than optimal for evaluation for pulmonary embolus. No major vessel pulmonary embolus is seen. To the extent that the peripheral pulmonary arteries can't be evaluated, no pulmonary embolus is evident. There is no thoracic aortic aneurysm or dissection. The great vessels which are visualized appear normal. The right common and left common carotid arteries arise as a common trunk, an anatomic variant. The pericardium is not thickened. There is left ventricular hypertrophy. Visualized thyroid appears normal. There are multiple small mediastinal lymph nodes. There is a lymph node to the left of the carina measuring 1.6 x 1.4 cm. There are several mildly prominent sub- carinal lymph nodes, largest measuring 1.7 x 1.2 cm. There is a pretracheal lymph node measuring 1.3 x 0.9 cm. Lungs/Pleura: There are patchy areas of airspace consolidation in the lateral and posterior segments of the  left lower lobe  and in the right upper lobe posterior segment near the major fissure. Upper abdomen: Visualized upper abdominal structures are unremarkable. Musculoskeletal: There is degenerative change in the thoracic spine. There are no blastic or lytic bone lesions. Review of the MIP images confirms the above findings. IMPRESSION: Less than optimal contrast bolus of the pulmonary arteries. No demonstrable pulmonary embolus to the extent that the pulmonary arteries can be evaluated. Areas of patchy infiltrate, most likely pneumonia in the posterior segment right upper lobe and in the lateral and posterior segments of the left lower lobe. Several mildly prominent lymph nodes, possibly of reactive etiology. Left ventricular hypertrophy is present. Electronically Signed   By: Lowella Grip III M.D.   On: 01/27/2016 13:33   US Venous Img Lower Unilateral Right  01/27/2016  CLINICAL DATA:  Right calf edema around new tattoo x 3 days. Bilateral leg swelling, shortness breath x3 months, previous tobacco abuse EXAM: RIGHT LOWER EXTREMITY VENOUS DOPPLER ULTRASOUND TECHNIQUE: Gray-scale sonography with compression, as well as color and duplex ultrasound, were performed to evaluate the deep venous system from the level of the common femoral vein through the popliteal and proximal calf veins. COMPARISON:  None FINDINGS: Normal compressibility of the common femoral, superficial femoral, and popliteal veins, as well as the proximal calf veins. No filling defects to suggest DVT on grayscale or color Doppler imaging. Doppler waveforms show normal direction of venous flow, normal respiratory phasicity and response to augmentation. Visualized segments of the saphenous venous system normal in caliber and compressibility. Prominent right inguinal lymph nodes measuring up to 17 mm short axis diameter. Survey views of the contralateral common femoral vein are unremarkable. IMPRESSION: 1. No evidence of lower extremity deep vein thrombosis,  right. 2. Enlarged right inguinal lymph nodes, possibly reactive but nonspecific. Electronically Signed   By: Lucrezia Europe M.D.   On: 01/27/2016 16:04   Dg Chest Port 1 View  01/31/2016  CLINICAL DATA:  45 year old female with shortness of breath EXAM: PORTABLE CHEST 1 VIEW COMPARISON:  Chest radiograph dated 01/27/2016 and CT dated 01/27/2016 FINDINGS: Single-view of the chest does not demonstrate a focal consolidation. Linear and streaky density in the left mid lung field and lung bases likely subsegmental atelectatic changes. Pneumonia is not excluded. There is no focal consolidation or pneumothorax. A small amount of fluid is noted in the right minor fissure. The cardiac silhouette is within normal limits with no acute osseous pathology. IMPRESSION: No focal consolidation. Trace fluid in the right minor fissure. Electronically Signed   By: Anner Crete M.D.   On: 01/31/2016 01:20    Medical Consultants:    None.  Anti-Infectives:   Azithromycin 01/31/16---> Rocephin 01/31/16---> Vancomycin 01/21/16---> 01/31/16 Levaquin 01/31/16---> 01/31/16  Subjective:   Lunell Robart reports that her dyspnea has improved. No significant sputum production. No nausea or vomiting. Lower extremity swelling has improved some.  Objective:    Filed Vitals:   02/01/16 2203 02/01/16 2205 02/02/16 0546 02/02/16 0812  BP:   113/45   Pulse:   87   Temp:   97.7 F (36.5 C)   TempSrc:   Oral   Resp:   20   Height:      Weight:      SpO2: 98% 98% 99% 98%    Intake/Output Summary (Last 24 hours) at 02/02/16 0825 Last data filed at 02/02/16 0656  Gross per 24 hour  Intake    660 ml  Output    700 ml  Net    -40 ml   Filed Weights   01/31/16 0004 01/31/16 0445 02/01/16 0505  Weight: 140.615 kg (310 lb) 142 kg (313 lb 0.9 oz) 142 kg (313 lb 0.9 oz)    Exam: Gen:  NAD Cardiovascular:  RRR, No M/R/G Respiratory:  Lungs sounds diminished with an occasional wheeze Gastrointestinal:  Abdomen soft,  NT/ND, + BS Extremities:  2+ pedal edema, tattoo right lower leg with erythema along the inked margins  Data Reviewed:    Labs: Basic Metabolic Panel:  Recent Labs Lab 01/27/16 1220 01/31/16 0020 01/31/16 0615  NA 137 136 135  K 3.8 3.7 3.8  CL 101 101 100*  CO2 29 26 25   GLUCOSE 113* 131* 186*  BUN 10 14 11   CREATININE 0.81 0.85 0.89  CALCIUM 8.7* 8.9 9.1   GFR Estimated Creatinine Clearance: 108.9 mL/min (by C-G formula based on Cr of 0.89). Liver Function Tests:  Recent Labs Lab 01/27/16 1220 01/31/16 0615  AST 19 20  ALT 14 12*  ALKPHOS 79 77  BILITOT 0.2* 0.3  PROT 7.8 7.8  ALBUMIN 3.1* 2.9*   CBC:  Recent Labs Lab 01/27/16 1140 01/31/16 0020 01/31/16 0615  WBC 5.9 6.9 8.0  NEUTROABS 3.8 4.6 7.2  HGB 11.6* 11.1* 11.2*  HCT 37.3 36.1 36.4  MCV 81.8 81.7 81.4  PLT 427* 403* 387   Cardiac Enzymes:  Recent Labs Lab 01/27/16 1140 01/31/16 0020  TROPONINI <0.03 <0.03   Sepsis Labs:  Recent Labs Lab 01/27/16 1140 01/31/16 0020 01/31/16 0058 01/31/16 0615  WBC 5.9 6.9  --  8.0  LATICACIDVEN  --   --  1.81  --    Microbiology Recent Results (from the past 240 hour(s))  Blood culture (routine x 2)     Status: None (Preliminary result)   Collection Time: 01/31/16 12:50 AM  Result Value Ref Range Status   Specimen Description BLOOD WRIST LEFT  Final   Special Requests   Final    BOTTLES DRAWN AEROBIC AND ANAEROBIC AER 5CC ANA 5CC   Culture   Final    NO GROWTH 1 DAY Performed at [facility]    Report Status PENDING  Incomplete  Blood culture (routine x 2)     Status: None (Preliminary result)   Collection Time: 01/31/16 12:50 AM  Result Value Ref Range Status   Specimen Description BLOOD LEFT ANTECUBITAL  Final   Special Requests   Final    BOTTLES DRAWN AEROBIC AND ANAEROBIC AER 5CC ANA 5CC   Culture   Final    NO GROWTH 1 DAY Performed at [facility]    Report Status PENDING  Incomplete  Culture,  sputum-assessment     Status: None   Collection Time: 02/01/16  6:49 AM  Result Value Ref Range Status   Specimen Description EXPECTORATED SPUTUM  Final   Special Requests NONE  Final   Sputum evaluation   Final    THIS SPECIMEN IS ACCEPTABLE. RESPIRATORY CULTURE REPORT TO FOLLOW.   Report Status 02/01/2016 FINAL  Final  Culture, respiratory (NON-Expectorated)     Status: None (Preliminary result)   Collection Time: 02/01/16  6:49 AM  Result Value Ref Range Status   Specimen Description EXPECTORATED SPUTUM  Final   Special Requests NONE  Final   Gram Stain   Final    FEW WBC PRESENT, PREDOMINANTLY MONONUCLEAR RARE SQUAMOUS EPITHELIAL CELLS PRESENT FEW GRAM POSITIVE COCCI IN PAIRS FEW GRAM NEGATIVE RODS THIS SPECIMEN IS ACCEPTABLE FOR  SPUTUM CULTURE Performed at [facility]    Culture PENDING  Incomplete   Report Status PENDING  Incomplete     Medications:   . antiseptic oral rinse  7 mL Mouth Rinse BID  . azithromycin  500 mg Intravenous Q24H  . budesonide (PULMICORT) nebulizer solution  0.25 mg Nebulization BID  . cefTRIAXone (ROCEPHIN)  IV  2 g Intravenous Q24H  . enoxaparin (LOVENOX) injection  70 mg Subcutaneous Q24H  . furosemide  40 mg Oral Daily  . levalbuterol  1.25 mg Nebulization TID  . methylPREDNISolone (SOLU-MEDROL) injection  60 mg Intravenous Q12H  . OXcarbazepine  450 mg Oral TID   Continuous Infusions:   Time spent: 25 minutes.   LOS: 2 days   RAMA,CHRISTINA  [facility] Pager [phone]. If unable to reach me by pager, please call my cell phone at [phone].  *Please refer to [email or web address], password [email or web address] to get updated schedule on who will round on this patient, as hospitalists switch teams weekly. If 7PM-7AM, please contact night-coverage at [email or web address], password [email or web address] for any overnight needs.  02/02/2016, 8:25 AM

## 2016-02-03 LAB — CULTURE, RESPIRATORY: CULTURE: NORMAL

## 2016-02-03 LAB — LEGIONELLA PNEUMOPHILA SEROGP 1 UR AG: L. pneumophila Serogp 1 Ur Ag: NEGATIVE

## 2016-02-03 LAB — CULTURE, RESPIRATORY W GRAM STAIN

## 2016-02-03 MED ORDER — POLYETHYLENE GLYCOL 3350 17 G PO PACK: 17.0000 g | PACK | Freq: Every day | ORAL | Status: DC | PRN

## 2016-02-03 MED ORDER — POLYETHYLENE GLYCOL 3350 17 G PO PACK
17.0000 g | PACK | Freq: Once | ORAL | Status: AC
  Administered 2016-02-03: 17 g via ORAL
  Filled 2016-02-03: qty 1

## 2016-02-03 NOTE — Progress Notes (Signed)
Progress Note   Megan Barrett PRX:[id number] DOB: [birth year] DOA: 01/31/2016 PCP: No PCP Per Patient   Brief Narrative:   Megan Barrett is an 45 y.o. female with PMH of morbid obesity admitted 01/31/16 with acute bronchitis versus community-acquired pneumonia as well as left lower leg cellulitis.  Assessment/Plan:   Principal Problem:   CAP (community acquired pneumonia) With acute bronchitis/SIRS CT from 01/27/16 did show patchy infiltrates in the right upper lobe and left lower lobe. SIRS criteria met but no evidence of end organ damage suggestive of sepsis.  Continue empiric Rocephin/azithromycin. Follow-up blood cultures and sputum culture. Influenza PCR was negative. HIV nonreactive. Strep pneumonia urinary antigen was negative. Legionella pending. Continue Solu-Medrol, bronchodilators and Pulmicort nebs.  Still with significant bronchospasm.  Active Problems:   Morbid obesity BMI 59.3 Dietitian consultation for diet education.    NEURALGIA, TRIGEMINAL Continue Trileptal.  Sinus tachycardia Continue Xopenex for treatment of bronchospasm. Heart rate reasonable.  Right lower extremity cellulitis versus inflammation Was given one dose of vancomycin on admission, but this does not appear to be ordered routinely. Lower extremity Doppler on 01/27/16 negative for DVT. Focal area of erythema surrounding newly inked tattoo, monitor for signs of progression, but suspect this is inflammatory rather than infection.  Essential hypertension Continue Lasix.  Tobacco abuse Counseled on cessation.    DVT Prophylaxis Lovenox ordered.   Family Communication/Anticipated D/C date and plan/Code Status   Family Communication: No family at the bedside. Disposition Plan/date: Home when stable, Possibly tomorrow depending on improvement of respiratory symptoms. Code Status: Full code.   Procedures and diagnostic studies:   Dg Chest 2 View  01/27/2016  CLINICAL DATA:  Shortness of  breath and lower extremity edema EXAM: CHEST  2 VIEW COMPARISON:  December 31, 2015 FINDINGS: There are areas of scarring in both mid lung regions as well as in the left base. There is no edema or consolidation. Heart size is upper normal with pulmonary vascularity within normal limits. No adenopathy. There is degenerative change in the thoracic spine. IMPRESSION: Stable scarring bilaterally. No edema or consolidation. No change in cardiac silhouette. Electronically Signed   By: Lowella Grip III M.D.   On: 01/27/2016 11:31   Ct Angio Chest Pe W/cm &/or Wo Cm  01/27/2016  CLINICAL DATA:  Shortness of breath and chest pain for several days EXAM: CT ANGIOGRAPHY CHEST WITH CONTRAST TECHNIQUE: Multidetector CT imaging of the chest was performed using the standard protocol during bolus administration of intravenous contrast. Multiplanar CT image reconstructions and MIPs were obtained to evaluate the vascular anatomy. CONTRAST:  118m OMNIPAQUE IOHEXOL 350 MG/ML SOLN COMPARISON:  Chest radiograph January 27, 2016 FINDINGS: Mediastinum/Lymph Nodes: The bolus is less than optimal for evaluation for pulmonary embolus. No major vessel pulmonary embolus is seen. To the extent that the peripheral pulmonary arteries can't be evaluated, no pulmonary embolus is evident. There is no thoracic aortic aneurysm or dissection. The great vessels which are visualized appear normal. The right common and left common carotid arteries arise as a common trunk, an anatomic variant. The pericardium is not thickened. There is left ventricular hypertrophy. Visualized thyroid appears normal. There are multiple small mediastinal lymph nodes. There is a lymph node to the left of the carina measuring 1.6 x 1.4 cm. There are several mildly prominent sub- carinal lymph nodes, largest measuring 1.7 x 1.2 cm. There is a pretracheal lymph node measuring 1.3 x 0.9 cm. Lungs/Pleura: There are patchy areas of airspace consolidation in the lateral  and  posterior segments of the left lower lobe and in the right upper lobe posterior segment near the major fissure. Upper abdomen: Visualized upper abdominal structures are unremarkable. Musculoskeletal: There is degenerative change in the thoracic spine. There are no blastic or lytic bone lesions. Review of the MIP images confirms the above findings. IMPRESSION: Less than optimal contrast bolus of the pulmonary arteries. No demonstrable pulmonary embolus to the extent that the pulmonary arteries can be evaluated. Areas of patchy infiltrate, most likely pneumonia in the posterior segment right upper lobe and in the lateral and posterior segments of the left lower lobe. Several mildly prominent lymph nodes, possibly of reactive etiology. Left ventricular hypertrophy is present. Electronically Signed   By: Lowella Grip III M.D.   On: 01/27/2016 13:33   US Venous Img Lower Unilateral Right  01/27/2016  CLINICAL DATA:  Right calf edema around new tattoo x 3 days. Bilateral leg swelling, shortness breath x3 months, previous tobacco abuse EXAM: RIGHT LOWER EXTREMITY VENOUS DOPPLER ULTRASOUND TECHNIQUE: Gray-scale sonography with compression, as well as color and duplex ultrasound, were performed to evaluate the deep venous system from the level of the common femoral vein through the popliteal and proximal calf veins. COMPARISON:  None FINDINGS: Normal compressibility of the common femoral, superficial femoral, and popliteal veins, as well as the proximal calf veins. No filling defects to suggest DVT on grayscale or color Doppler imaging. Doppler waveforms show normal direction of venous flow, normal respiratory phasicity and response to augmentation. Visualized segments of the saphenous venous system normal in caliber and compressibility. Prominent right inguinal lymph nodes measuring up to 17 mm short axis diameter. Survey views of the contralateral common femoral vein are unremarkable. IMPRESSION: 1. No evidence of  lower extremity deep vein thrombosis, right. 2. Enlarged right inguinal lymph nodes, possibly reactive but nonspecific. Electronically Signed   By: Lucrezia Europe M.D.   On: 01/27/2016 16:04   Dg Chest Port 1 View  01/31/2016  CLINICAL DATA:  45 year old female with shortness of breath EXAM: PORTABLE CHEST 1 VIEW COMPARISON:  Chest radiograph dated 01/27/2016 and CT dated 01/27/2016 FINDINGS: Single-view of the chest does not demonstrate a focal consolidation. Linear and streaky density in the left mid lung field and lung bases likely subsegmental atelectatic changes. Pneumonia is not excluded. There is no focal consolidation or pneumothorax. A small amount of fluid is noted in the right minor fissure. The cardiac silhouette is within normal limits with no acute osseous pathology. IMPRESSION: No focal consolidation. Trace fluid in the right minor fissure. Electronically Signed   By: Anner Crete M.D.   On: 01/31/2016 01:20    Medical Consultants:    None.  Anti-Infectives:   Azithromycin 01/31/16---> Rocephin 01/31/16---> Vancomycin 01/21/16---> 01/31/16 Levaquin 01/31/16---> 01/31/16  Subjective:   Megan Barrett continues to get significantly dyspneic with activity, no change from yesterday. No significant sputum production. No nausea or vomiting. Appetite OK.  No BM in 48 hours.  Objective:    Filed Vitals:   02/02/16 2140 02/02/16 2226 02/03/16 0552 02/03/16 0700  BP:  125/62 140/82   Pulse:  95 84   Temp:  97.9 F (36.6 C) 97.9 F (36.6 C)   TempSrc:  Oral Oral   Resp:  20 20   Height:      Weight:    142 kg (313 lb 0.9 oz)  SpO2: 99% 96% 100%     Intake/Output Summary (Last 24 hours) at 02/03/16 0840 Last data filed at 02/02/16 2000  Gross per 24 hour  Intake    620 ml  Output   1250 ml  Net   -630 ml   Filed Weights   01/31/16 0445 02/01/16 0505 02/03/16 0700  Weight: 142 kg (313 lb 0.9 oz) 142 kg (313 lb 0.9 oz) 142 kg (313 lb 0.9 oz)    Exam: Gen:   NAD Cardiovascular:  Tachy, No M/R/G Respiratory:  Lungs sounds with bilateral expiratory wheezes Gastrointestinal:  Abdomen soft, NT/ND, + BS Extremities:  2+ pedal edema, tattoo right lower leg with erythema along the inked margins  Data Reviewed:    Labs: Basic Metabolic Panel:  Recent Labs Lab 01/27/16 1220 01/31/16 0020 01/31/16 0615 02/02/16 0813  NA 137 136 135 136  K 3.8 3.7 3.8 4.5  CL 101 101 100* 100*  CO2 29 26 25 27   GLUCOSE 113* 131* 186* 162*  BUN 10 14 11 17   CREATININE 0.81 0.85 0.89 0.76  CALCIUM 8.7* 8.9 9.1 9.4   GFR Estimated Creatinine Clearance: 121.1 mL/min (by C-G formula based on Cr of 0.76). Liver Function Tests:  Recent Labs Lab 01/27/16 1220 01/31/16 0615  AST 19 20  ALT 14 12*  ALKPHOS 79 77  BILITOT 0.2* 0.3  PROT 7.8 7.8  ALBUMIN 3.1* 2.9*   CBC:  Recent Labs Lab 01/27/16 1140 01/31/16 0020 01/31/16 0615 02/02/16 0813  WBC 5.9 6.9 8.0 13.7*  NEUTROABS 3.8 4.6 7.2  --   HGB 11.6* 11.1* 11.2* 11.2*  HCT 37.3 36.1 36.4 38.0  MCV 81.8 81.7 81.4 83.0  PLT 427* 403* 387 418*   Cardiac Enzymes:  Recent Labs Lab 01/27/16 1140 01/31/16 0020  TROPONINI <0.03 <0.03   Sepsis Labs:  Recent Labs Lab 01/27/16 1140 01/31/16 0020 01/31/16 0058 01/31/16 0615 02/02/16 0813  WBC 5.9 6.9  --  8.0 13.7*  LATICACIDVEN  --   --  1.81  --   --    Microbiology Recent Results (from the past 240 hour(s))  Blood culture (routine x 2)     Status: None (Preliminary result)   Collection Time: 01/31/16 12:50 AM  Result Value Ref Range Status   Specimen Description BLOOD WRIST LEFT  Final   Special Requests   Final    BOTTLES DRAWN AEROBIC AND ANAEROBIC AER 5CC ANA 5CC   Culture   Final    NO GROWTH 2 DAYS Performed at [facility]    Report Status PENDING  Incomplete  Blood culture (routine x 2)     Status: None (Preliminary result)   Collection Time: 01/31/16 12:50 AM  Result Value Ref Range Status   Specimen  Description BLOOD LEFT ANTECUBITAL  Final   Special Requests   Final    BOTTLES DRAWN AEROBIC AND ANAEROBIC AER 5CC ANA 5CC   Culture   Final    NO GROWTH 2 DAYS Performed at [facility]    Report Status PENDING  Incomplete  Culture, sputum-assessment     Status: None   Collection Time: 02/01/16  6:49 AM  Result Value Ref Range Status   Specimen Description EXPECTORATED SPUTUM  Final   Special Requests NONE  Final   Sputum evaluation   Final    THIS SPECIMEN IS ACCEPTABLE. RESPIRATORY CULTURE REPORT TO FOLLOW.   Report Status 02/01/2016 FINAL  Final  Culture, respiratory (NON-Expectorated)     Status: None (Preliminary result)   Collection Time: 02/01/16  6:49 AM  Result Value Ref Range Status   Specimen Description EXPECTORATED SPUTUM  Final   Special Requests NONE  Final   Gram Stain   Final    FEW WBC PRESENT, PREDOMINANTLY MONONUCLEAR RARE SQUAMOUS EPITHELIAL CELLS PRESENT FEW GRAM POSITIVE COCCI IN PAIRS FEW GRAM NEGATIVE RODS THIS SPECIMEN IS ACCEPTABLE FOR SPUTUM CULTURE Performed at [facility]    Culture   Final    NORMAL OROPHARYNGEAL FLORA Performed at [facility]    Report Status PENDING  Incomplete     Medications:   . antiseptic oral rinse  7 mL Mouth Rinse BID  . azithromycin  500 mg Intravenous Q24H  . budesonide (PULMICORT) nebulizer solution  0.25 mg Nebulization BID  . cefTRIAXone (ROCEPHIN)  IV  2 g Intravenous Q24H  . enoxaparin (LOVENOX) injection  70 mg Subcutaneous Q24H  . furosemide  40 mg Oral Daily  . levalbuterol  1.25 mg Nebulization TID  . methylPREDNISolone (SOLU-MEDROL) injection  60 mg Intravenous Q24H  . OXcarbazepine  450 mg Oral TID   Continuous Infusions:   Time spent: 25 minutes.   LOS: 3 days   [facility] Pager [phone]. If unable to reach me by pager, please call my cell phone at [phone].  *Please refer to [email or web address], password [email or web address] to get updated schedule on who will  round on this patient, as hospitalists switch teams weekly. If 7PM-7AM, please contact night-coverage at [email or web address], password [email or web address] for any overnight needs.  02/03/2016, 8:40 AM

## 2016-02-03 NOTE — Plan of Care (Signed)
Problem: Food- and Nutrition-Related Knowledge Deficit (NB-1.1) Goal: Nutrition education Formal process to instruct or train a patient/client in a skill or to impart knowledge to help patients/clients voluntarily manage or modify food choices and eating behavior to maintain or improve health. Outcome: Adequate for Discharge RD consulted for nutrition education regarding weight loss.  Lipid Panel  No results found for: CHOL, TRIG, HDL, CHOLHDL, VLDL, LDLCALC, LDLDIRECT  Body mass index is 59.18 kg/(m^2). Pt meets criteria for extreme obesity, class III based on current BMI.  Hx obtained from pt at bedside. She reports that she had lost a lot of weight (350# down to 287#) within the past several months, however, gained some weight back due to difficulty breathing and initiation of steroids. 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 She consumes 2 meals per day and has transitioned to consuming baked foods and more vegetables. She reports increased fluid retention and using "natural methods" in addition to medications to assist with this. RD discussed alternatives to seasoning foods other than salt. Most of the session was spent using motivational interviewing to assist pt to further improve her health through continued lifestyle change.  RD provided "Heart Healthy Nutrition Therapy" handout from the [employer]. Reviewed patient's dietary recall. Provided examples on ways to decrease sodium and fat intake in diet. Discouraged intake of processed foods and use of salt shaker. Encouraged fresh fruits and vegetables as well as whole grain sources of carbohydrates to maximize fiber intake.  Emphasized the importance of serving sizes and provided examples of correct portions of common foods. Discussed importance of controlled and consistent intake throughout the day. Provided examples of ways to balance meals/snacks and encouraged intake of high-fiber, whole  grain complex carbohydrates. Emphasized the importance of hydration with calorie-free beverages and limiting sugar-sweetened beverages. Encouraged pt to discuss physical activity options with physician. Teach back method used.  Expect fair compliance.  Current diet order is Heart Healthy, patient is consuming approximately 100% of meals at this time. Labs and medications reviewed. No further nutrition interventions warranted at this time. RD contact information provided. If additional nutrition issues arise, please re-consult RD.  Katriel Cutsforth A. Mayford KnifeWilliams, RD, LDN, CDE Pager: [phone] After hours Pager: [phone]

## 2016-02-04 NOTE — Progress Notes (Signed)
Progress Note   Megan Barrett ZOX:[id number] DOB: 1971-05-01 DOA: 01/31/2016 PCP: No PCP Per Patient   Brief Narrative:   Megan Barrett is an 45 y.o. female with PMH of morbid obesity admitted 01/31/16 with acute bronchitis versus community-acquired pneumonia as well as left lower leg cellulitis.  Assessment/Plan:   Principal Problem:   CAP (community acquired pneumonia) With acute bronchitis/SIRS CT from 01/27/16 did show patchy infiltrates in the right upper lobe and left lower lobe. SIRS criteria met but no evidence of end organ damage suggestive of sepsis.  Continue empiric Rocephin/azithromycin. Follow-up blood cultures. Sputum culture grew normal oropharyngeal flora. Influenza PCR was negative. HIV nonreactive. Strep pneumonia urinary antigen was negative. Legionella negative. Continue Solu-Medrol, bronchodilators and Pulmicort nebs.  Bronchospasm improving. Wean solumedrol.  Active Problems:   Morbid obesity BMI 59.3 Dietitian consultation for diet education.    NEURALGIA, TRIGEMINAL Continue Trileptal.  Sinus tachycardia Continue Xopenex for treatment of bronchospasm. Heart rate reasonable.  Right lower extremity cellulitis versus inflammation Was given one dose of vancomycin on admission, but this does not appear to be ordered routinely. Lower extremity Doppler on 01/27/16 negative for DVT. Focal area of erythema surrounding newly inked tattoo, monitor for signs of progression, but suspect this is inflammatory rather than infection.  Essential hypertension Continue Lasix.  Tobacco abuse Counseled on cessation.    DVT Prophylaxis Lovenox ordered.   Family Communication/Anticipated D/C date and plan/Code Status   Family Communication: No family at the bedside. Disposition Plan/date: Home when stable, Possibly tomorrow depending on improvement of respiratory symptoms. Code Status: Full code.   Procedures and diagnostic studies:   Dg Chest 2 View  01/27/2016   CLINICAL DATA:  Shortness of breath and lower extremity edema EXAM: CHEST  2 VIEW COMPARISON:  December 31, 2015 FINDINGS: There are areas of scarring in both mid lung regions as well as in the left base. There is no edema or consolidation. Heart size is upper normal with pulmonary vascularity within normal limits. No adenopathy. There is degenerative change in the thoracic spine. IMPRESSION: Stable scarring bilaterally. No edema or consolidation. No change in cardiac silhouette. Electronically Signed   By: Lowella Grip III M.D.   On: 01/27/2016 11:31   Ct Angio Chest Pe W/cm &/or Wo Cm  01/27/2016  CLINICAL DATA:  Shortness of breath and chest pain for several days EXAM: CT ANGIOGRAPHY CHEST WITH CONTRAST TECHNIQUE: Multidetector CT imaging of the chest was performed using the standard protocol during bolus administration of intravenous contrast. Multiplanar CT image reconstructions and MIPs were obtained to evaluate the vascular anatomy. CONTRAST:  178m OMNIPAQUE IOHEXOL 350 MG/ML SOLN COMPARISON:  Chest radiograph January 27, 2016 FINDINGS: Mediastinum/Lymph Nodes: The bolus is less than optimal for evaluation for pulmonary embolus. No major vessel pulmonary embolus is seen. To the extent that the peripheral pulmonary arteries can't be evaluated, no pulmonary embolus is evident. There is no thoracic aortic aneurysm or dissection. The great vessels which are visualized appear normal. The right common and left common carotid arteries arise as a common trunk, an anatomic variant. The pericardium is not thickened. There is left ventricular hypertrophy. Visualized thyroid appears normal. There are multiple small mediastinal lymph nodes. There is a lymph node to the left of the carina measuring 1.6 x 1.4 cm. There are several mildly prominent sub- carinal lymph nodes, largest measuring 1.7 x 1.2 cm. There is a pretracheal lymph node measuring 1.3 x 0.9 cm. Lungs/Pleura: There are patchy areas of airspace  consolidation in the lateral and posterior segments of the left lower lobe and in the right upper lobe posterior segment near the major fissure. Upper abdomen: Visualized upper abdominal structures are unremarkable. Musculoskeletal: There is degenerative change in the thoracic spine. There are no blastic or lytic bone lesions. Review of the MIP images confirms the above findings. IMPRESSION: Less than optimal contrast bolus of the pulmonary arteries. No demonstrable pulmonary embolus to the extent that the pulmonary arteries can be evaluated. Areas of patchy infiltrate, most likely pneumonia in the posterior segment right upper lobe and in the lateral and posterior segments of the left lower lobe. Several mildly prominent lymph nodes, possibly of reactive etiology. Left ventricular hypertrophy is present. Electronically Signed   By: Lowella Grip III M.D.   On: 01/27/2016 13:33   US Venous Img Lower Unilateral Right  01/27/2016  CLINICAL DATA:  Right calf edema around new tattoo x 3 days. Bilateral leg swelling, shortness breath x3 months, previous tobacco abuse EXAM: RIGHT LOWER EXTREMITY VENOUS DOPPLER ULTRASOUND TECHNIQUE: Gray-scale sonography with compression, as well as color and duplex ultrasound, were performed to evaluate the deep venous system from the level of the common femoral vein through the popliteal and proximal calf veins. COMPARISON:  None FINDINGS: Normal compressibility of the common femoral, superficial femoral, and popliteal veins, as well as the proximal calf veins. No filling defects to suggest DVT on grayscale or color Doppler imaging. Doppler waveforms show normal direction of venous flow, normal respiratory phasicity and response to augmentation. Visualized segments of the saphenous venous system normal in caliber and compressibility. Prominent right inguinal lymph nodes measuring up to 17 mm short axis diameter. Survey views of the contralateral common femoral vein are  unremarkable. IMPRESSION: 1. No evidence of lower extremity deep vein thrombosis, right. 2. Enlarged right inguinal lymph nodes, possibly reactive but nonspecific. Electronically Signed   By: Lucrezia Europe M.D.   On: 01/27/2016 16:04   Dg Chest Port 1 View  01/31/2016  CLINICAL DATA:  45 year old female with shortness of breath EXAM: PORTABLE CHEST 1 VIEW COMPARISON:  Chest radiograph dated 01/27/2016 and CT dated 01/27/2016 FINDINGS: Single-view of the chest does not demonstrate a focal consolidation. Linear and streaky density in the left mid lung field and lung bases likely subsegmental atelectatic changes. Pneumonia is not excluded. There is no focal consolidation or pneumothorax. A small amount of fluid is noted in the right minor fissure. The cardiac silhouette is within normal limits with no acute osseous pathology. IMPRESSION: No focal consolidation. Trace fluid in the right minor fissure. Electronically Signed   By: Anner Crete M.D.   On: 01/31/2016 01:20    Medical Consultants:    None.  Anti-Infectives:   Azithromycin 01/31/16---> Rocephin 01/31/16---> Vancomycin 01/21/16---> 01/31/16 Levaquin 01/31/16---> 01/31/16  Subjective:   Gaynell Eggleton continues to get significantly dyspneic with activity, no change from yesterday. No significant sputum production. No nausea or vomiting. Appetite OK.  No BM in 48 hours.  Objective:    Filed Vitals:   02/04/16 0456 02/04/16 0500 02/04/16 0839 02/04/16 0849  BP: 120/58     Pulse: 88     Temp: 97.7 F (36.5 C)     TempSrc:      Resp: 20     Height:      Weight:  143.4 kg (316 lb 2.2 oz)    SpO2: 99%  97% 98%    Intake/Output Summary (Last 24 hours) at 02/04/16 0909 Last data filed at 02/04/16 [phone]  Gross per 24 hour  Intake    590 ml  Output   2700 ml  Net  -2110 ml   Filed Weights   02/01/16 0505 02/03/16 0700 02/04/16 0500  Weight: 142 kg (313 lb 0.9 oz) 142 kg (313 lb 0.9 oz) 143.4 kg (316 lb 2.2 oz)    Exam: Gen:   NAD Cardiovascular:  Tachy, No M/R/G Respiratory:  Lungs sounds with bilateral expiratory wheezes Gastrointestinal:  Abdomen soft, NT/ND, + BS Extremities:  2+ pedal edema, tattoo right lower leg with erythema along the inked margins  Data Reviewed:    Labs: Basic Metabolic Panel:  Recent Labs Lab 01/31/16 0020 01/31/16 0615 02/02/16 0813  NA 136 135 136  K 3.7 3.8 4.5  CL 101 100* 100*  CO2 26 25 27   GLUCOSE 131* 186* 162*  BUN 14 11 17   CREATININE 0.85 0.89 0.76  CALCIUM 8.9 9.1 9.4   GFR Estimated Creatinine Clearance: 121.8 mL/min (by C-G formula based on Cr of 0.76). Liver Function Tests:  Recent Labs Lab 01/31/16 0615  AST 20  ALT 12*  ALKPHOS 77  BILITOT 0.3  PROT 7.8  ALBUMIN 2.9*   CBC:  Recent Labs Lab 01/31/16 0020 01/31/16 0615 02/02/16 0813  WBC 6.9 8.0 13.7*  NEUTROABS 4.6 7.2  --   HGB 11.1* 11.2* 11.2*  HCT 36.1 36.4 38.0  MCV 81.7 81.4 83.0  PLT 403* 387 418*   Cardiac Enzymes:  Recent Labs Lab 01/31/16 0020  TROPONINI <0.03   Sepsis Labs:  Recent Labs Lab 01/31/16 0020 01/31/16 0058 01/31/16 0615 02/02/16 0813  WBC 6.9  --  8.0 13.7*  LATICACIDVEN  --  1.81  --   --    Microbiology Recent Results (from the past 240 hour(s))  Blood culture (routine x 2)     Status: None (Preliminary result)   Collection Time: 01/31/16 12:50 AM  Result Value Ref Range Status   Specimen Description BLOOD WRIST LEFT  Final   Special Requests   Final    BOTTLES DRAWN AEROBIC AND ANAEROBIC AER 5CC ANA 5CC   Culture   Final    NO GROWTH 3 DAYS Performed at [facility]    Report Status PENDING  Incomplete  Blood culture (routine x 2)     Status: None (Preliminary result)   Collection Time: 01/31/16 12:50 AM  Result Value Ref Range Status   Specimen Description BLOOD LEFT ANTECUBITAL  Final   Special Requests   Final    BOTTLES DRAWN AEROBIC AND ANAEROBIC AER 5CC ANA 5CC   Culture   Final    NO GROWTH 3 DAYS Performed at  [facility]    Report Status PENDING  Incomplete  Culture, sputum-assessment     Status: None   Collection Time: 02/01/16  6:49 AM  Result Value Ref Range Status   Specimen Description EXPECTORATED SPUTUM  Final   Special Requests NONE  Final   Sputum evaluation   Final    THIS SPECIMEN IS ACCEPTABLE. RESPIRATORY CULTURE REPORT TO FOLLOW.   Report Status 02/01/2016 FINAL  Final  Culture, respiratory (NON-Expectorated)     Status: None   Collection Time: 02/01/16  6:49 AM  Result Value Ref Range Status   Specimen Description EXPECTORATED SPUTUM  Final   Special Requests NONE  Final   Gram Stain   Final    FEW WBC PRESENT, PREDOMINANTLY MONONUCLEAR RARE SQUAMOUS EPITHELIAL CELLS PRESENT FEW GRAM POSITIVE COCCI IN PAIRS FEW GRAM NEGATIVE  RODS THIS SPECIMEN IS ACCEPTABLE FOR SPUTUM CULTURE Performed at [facility]    Culture   Final    NORMAL OROPHARYNGEAL FLORA Performed at [facility]    Report Status 02/03/2016 FINAL  Final     Medications:   . antiseptic oral rinse  7 mL Mouth Rinse BID  . azithromycin  500 mg Intravenous Q24H  . budesonide (PULMICORT) nebulizer solution  0.25 mg Nebulization BID  . cefTRIAXone (ROCEPHIN)  IV  2 g Intravenous Q24H  . enoxaparin (LOVENOX) injection  70 mg Subcutaneous Q24H  . furosemide  40 mg Oral Daily  . levalbuterol  1.25 mg Nebulization TID  . methylPREDNISolone (SOLU-MEDROL) injection  60 mg Intravenous Q24H  . OXcarbazepine  450 mg Oral TID   Continuous Infusions:   Time spent: 25 minutes.   LOS: 4 days   [facility] Pager [phone]. If unable to reach me by pager, please call my cell phone at [phone].  *Please refer to [email or web address], password [email or web address] to get updated schedule on who will round on this patient, as hospitalists switch teams weekly. If 7PM-7AM, please contact night-coverage at [email or web address], password [email or web address] for any overnight needs.  02/04/2016, 9:09 AM

## 2016-02-04 NOTE — Progress Notes (Signed)
UR COMPLETED  

## 2016-02-04 NOTE — Progress Notes (Signed)
SATURATION QUALIFICATIONS: (This note is used to comply with regulatory documentation for home oxygen)  Patient Saturations on Room Air at Rest = 98%  Patient Saturations on Room Air while Ambulating = 92%  No oxygen needed.

## 2016-02-05 LAB — CULTURE, BLOOD (ROUTINE X 2): Culture: NO GROWTH

## 2016-02-05 MED ORDER — ALBUTEROL SULFATE HFA 108 (90 BASE) MCG/ACT IN AERS: 2.0000 | INHALATION_SPRAY | Freq: Every day | RESPIRATORY_TRACT | Status: AC | PRN

## 2016-02-05 MED ORDER — PREDNISONE 10 MG (21) PO TBPK: ORAL_TABLET | ORAL | Status: AC

## 2016-02-05 MED ORDER — BUDESONIDE 0.25 MG/2ML IN SUSP: 0.2500 mg | Freq: Two times a day (BID) | RESPIRATORY_TRACT | Status: AC

## 2016-02-05 NOTE — Discharge Summary (Signed)
Physician Discharge Summary  Megan Barrett EVO:[id number] DOB: 1971-01-20 DOA: 2016/02/07  PCP: No PCP Per Patient  Admit date: 07-Feb-2016 Discharge date: 02/05/2016   Recommendations for Outpatient Follow-Up:   1. Follow-up with PCP as needed. The patient was referred to the [facility] to obtain a PCP.   Discharge Diagnosis:   Principal Problem:    CAP (community acquired pneumonia) Active Problems:    NEURALGIA, TRIGEMINAL    Acute bronchitis    Cellulitis of right lower extremity    SIRS (systemic inflammatory response syndrome) (HCC)    Morbid obesity with BMI of 50.0-59.9, adult [facility])   Discharge disposition:  Home.    Discharge Condition: Improved.  Diet recommendation: Low sodium, heart healthy.    History of Present Illness:   Megan Barrett is an 45 y.o. female with PMH of morbid obesity admitted 2016-02-07 with acute bronchitis versus community-acquired pneumonia as well as left lower leg cellulitis.  Hospital Course by Problem:   Principal Problem:  CAP (community acquired pneumonia) With acute bronchitis/SIRS CT from 01/27/16 did show patchy infiltrates in the right upper lobe and left lower lobe. SIRS criteria met but no evidence of end organ damage suggestive of sepsis. Treated with empiric Rocephin/azithromycin. One blood culture negative, the other growing gram-positive rods which is probably corynebacterium species, a contaminant. Sputum culture grew normal oropharyngeal flora. Influenza PCR was negative. HIV nonreactive. Strep pneumonia urinary antigen was negative. Legionella negative. Patient was also treated with Solu-Medrol for bronchospasm while in the hospital, and will be discharged home on a prednisone taper. No further antibiotics were felt to be needed.  Active Problems:  Morbid obesity BMI 59.3 Counseled regarding need for weight loss.   NEURALGIA, TRIGEMINAL Continue Trileptal.  Sinus tachycardia Continue Xopenex for treatment of  bronchospasm. Heart rate reasonable.  Right lower extremity cellulitis versus inflammation Was given one dose of vancomycin on admission, but this does not appear to be ordered routinely. Lower extremity Doppler on 01/27/16 negative for DVT. Focal area of erythema surrounding newly inked tattoo, monitor for signs of progression, but this was felt to be inflammatory rather than infection.  Essential hypertension Continue Lasix.  Tobacco abuse Counseled on cessation.    Medical Consultants:    None.   Discharge Exam:   Filed Vitals:   02/04/16 2312 02/05/16 0535  BP: 125/65 126/75  Pulse: 85 82  Temp: 98.3 F (36.8 C) 97.8 F (36.6 C)  Resp: 18 18   Filed Vitals:   02/04/16 1430 02/04/16 2231 02/04/16 2312 02/05/16 0535  BP: 135/82  125/65 126/75  Pulse: 93  85 82  Temp: 97.3 F (36.3 C)  98.3 F (36.8 C) 97.8 F (36.6 C)  TempSrc: Oral     Resp: 18  18 18   Height:      Weight:    144.2 kg (317 lb 14.5 oz)  SpO2: 97% 99% 97% 98%    Gen:  NAD Cardiovascular:  RRR, No M/R/G Respiratory: Lungs diminished with an occasional wheeze Gastrointestinal: Abdomen soft, NT/ND with normal active bowel sounds. Extremities: No C/E/C   The results of significant diagnostics from this hospitalization (including imaging, microbiology, ancillary and laboratory) are listed below for reference.     Procedures and Diagnostic Studies:   Dg Chest Port 1 View  07-Feb-2016  CLINICAL DATA:  45 year old female with shortness of breath EXAM: PORTABLE CHEST 1 VIEW COMPARISON:  Chest radiograph dated 01/27/2016 and CT dated 01/27/2016 FINDINGS: Single-view of the chest does not demonstrate a focal consolidation. Linear  and streaky density in the left mid lung field and lung bases likely subsegmental atelectatic changes. Pneumonia is not excluded. There is no focal consolidation or pneumothorax. A small amount of fluid is noted in the right minor fissure. The cardiac silhouette is within normal  limits with no acute osseous pathology. IMPRESSION: No focal consolidation. Trace fluid in the right minor fissure. Electronically Signed   By: Anner Crete M.D.   On: 01/31/2016 01:20   Labs:   Basic Metabolic Panel:  Recent Labs Lab 01/31/16 0020 01/31/16 0615 02/02/16 0813  NA 136 135 136  K 3.7 3.8 4.5  CL 101 100* 100*  CO2 26 25 27   GLUCOSE 131* 186* 162*  BUN 14 11 17   CREATININE 0.85 0.89 0.76  CALCIUM 8.9 9.1 9.4   GFR Estimated Creatinine Clearance: 122.4 mL/min (by C-G formula based on Cr of 0.76). Liver Function Tests:  Recent Labs Lab 01/31/16 0615  AST 20  ALT 12*  ALKPHOS 77  BILITOT 0.3  PROT 7.8  ALBUMIN 2.9*   CBC:  Recent Labs Lab 01/31/16 0020 01/31/16 0615 02/02/16 0813  WBC 6.9 8.0 13.7*  NEUTROABS 4.6 7.2  --   HGB 11.1* 11.2* 11.2*  HCT 36.1 36.4 38.0  MCV 81.7 81.4 83.0  PLT 403* 387 418*   Cardiac Enzymes:  Recent Labs Lab 01/31/16 0020  TROPONINI <0.03   Microbiology Recent Results (from the past 240 hour(s))  Blood culture (routine x 2)     Status: None   Collection Time: 01/31/16 12:50 AM  Result Value Ref Range Status   Specimen Description BLOOD WRIST LEFT  Final   Special Requests   Final    BOTTLES DRAWN AEROBIC AND ANAEROBIC AER 5CC ANA 5CC   Culture   Final    NO GROWTH 5 DAYS Performed at [facility]    Report Status 02/05/2016 FINAL  Final  Blood culture (routine x 2)     Status: None (Preliminary result)   Collection Time: 01/31/16 12:50 AM  Result Value Ref Range Status   Specimen Description BLOOD LEFT ANTECUBITAL  Final   Special Requests   Final    BOTTLES DRAWN AEROBIC AND ANAEROBIC AER 5CC ANA 5CC   Culture  Setup Time   Final    GRAM POSITIVE RODS ANAEROBIC BOTTLE ONLY CRITICAL RESULT CALLED TO, READ BACK BY AND VERIFIED WITH: Luvenia Redden RN 2001 02/04/16 A BROWNING    Culture   Final    CULTURE REINCUBATED FOR BETTER GROWTH Performed at [facility]    Report Status  PENDING  Incomplete  Culture, sputum-assessment     Status: None   Collection Time: 02/01/16  6:49 AM  Result Value Ref Range Status   Specimen Description EXPECTORATED SPUTUM  Final   Special Requests NONE  Final   Sputum evaluation   Final    THIS SPECIMEN IS ACCEPTABLE. RESPIRATORY CULTURE REPORT TO FOLLOW.   Report Status 02/01/2016 FINAL  Final  Culture, respiratory (NON-Expectorated)     Status: None   Collection Time: 02/01/16  6:49 AM  Result Value Ref Range Status   Specimen Description EXPECTORATED SPUTUM  Final   Special Requests NONE  Final   Gram Stain   Final    FEW WBC PRESENT, PREDOMINANTLY MONONUCLEAR RARE SQUAMOUS EPITHELIAL CELLS PRESENT FEW GRAM POSITIVE COCCI IN PAIRS FEW GRAM NEGATIVE RODS THIS SPECIMEN IS ACCEPTABLE FOR SPUTUM CULTURE Performed at [facility]    Culture   Final  NORMAL OROPHARYNGEAL FLORA Performed at [facility]    Report Status 02/03/2016 FINAL  Final     Discharge Instructions:   Discharge Instructions    Call MD for:  difficulty breathing, headache or visual disturbances    Complete by:  As directed      Call MD for:  extreme fatigue    Complete by:  As directed      Call MD for:  temperature >100.4    Complete by:  As directed      Diet - low sodium heart healthy    Complete by:  As directed      Increase activity slowly    Complete by:  As directed             Medication List    STOP taking these medications        doxycycline 100 MG capsule  Commonly known as:  VIBRAMYCIN     HYDROcodone-acetaminophen 5-325 MG tablet  Commonly known as:  NORCO/VICODIN     oxyCODONE-acetaminophen 5-325 MG tablet  Commonly known as:  PERCOCET      TAKE these medications        albuterol 108 (90 Base) MCG/ACT inhaler  Commonly known as:  PROVENTIL HFA;VENTOLIN HFA  Inhale 2 puffs into the lungs daily as needed.     budesonide 0.25 MG/2ML nebulizer solution  Commonly known as:  PULMICORT  Take 2 mLs  (0.25 mg total) by nebulization 2 (two) times daily.     loratadine 10 MG tablet  Commonly known as:  CLARITIN  Take 10 mg by mouth daily.     predniSONE 10 MG (21) Tbpk tablet  Commonly known as:  STERAPRED UNI-PAK 21 TAB  Take as directed.           Follow-up Information    Schedule an appointment as soon as possible for a visit with [place].   Why:  As needed   Contact information:   [address] [place]  [address] [phone]       Time coordinating discharge: 35 minutes.  Signed:  Reginaldo Hazard  Pager [phone] [facility] 02/05/2016, 2:12 PM

## 2016-02-05 NOTE — Discharge Instructions (Signed)
Asthma, Acute Bronchospasm °Acute bronchospasm caused by asthma is also referred to as an asthma attack. Bronchospasm means your air passages become narrowed. The narrowing is caused by inflammation and tightening of the muscles in the air tubes (bronchi) in your lungs. This can make it hard to breathe or cause you to wheeze and cough. °CAUSES °Possible triggers are: °· Animal dander from the skin, hair, or feathers of animals. °· Dust mites contained in house dust. °· Cockroaches. °· Pollen from trees or grass. °· Mold. °· Cigarette or tobacco smoke. °· Air pollutants such as dust, household cleaners, hair sprays, aerosol sprays, paint fumes, strong chemicals, or strong odors. °· Cold air or weather changes. Cold air may trigger inflammation. Winds increase molds and pollens in the air. °· Strong emotions such as crying or laughing hard. °· Stress. °· Certain medicines such as aspirin or beta-blockers. °· Sulfites in foods and drinks, such as dried fruits and wine. °· Infections or inflammatory conditions, such as a flu, cold, or inflammation of the nasal membranes (rhinitis). °· Gastroesophageal reflux disease (GERD). GERD is a condition where stomach acid backs up into your esophagus. °· Exercise or strenuous activity. °SIGNS AND SYMPTOMS  °· Wheezing. °· Excessive coughing, particularly at night. °· Chest tightness. °· Shortness of breath. °DIAGNOSIS  °Your health care provider will ask you about your medical history and perform a physical exam. A chest X-ray or blood testing may be performed to look for other causes of your symptoms or other conditions that may have triggered your asthma attack.  °TREATMENT  °Treatment is aimed at reducing inflammation and opening up the airways in your lungs.  Most asthma attacks are treated with inhaled medicines. These include quick relief or rescue medicines (such as bronchodilators) and controller medicines (such as inhaled corticosteroids). These medicines are sometimes  given through an inhaler or a nebulizer. Systemic steroid medicine taken by mouth or given through an IV tube also can be used to reduce the inflammation when an attack is moderate or severe. Antibiotic medicines are only used if a bacterial infection is present.  °HOME CARE INSTRUCTIONS  °· Rest. °· Drink plenty of liquids. This helps the mucus to remain thin and be easily coughed up. Only use caffeine in moderation and do not use alcohol until you have recovered from your illness. °· Do not smoke. Avoid being exposed to secondhand smoke. °· You play a critical role in keeping yourself in good health. Avoid exposure to things that cause you to wheeze or to have breathing problems. °· Keep your medicines up-to-date and available. Carefully follow your health care provider's treatment plan. °· Take your medicine exactly as prescribed. °· When pollen or pollution is bad, keep windows closed and use an air conditioner or go to places with air conditioning. °· Asthma requires careful medical care. See your health care provider for a follow-up as advised. If you are more than [redacted] weeks pregnant and you were prescribed any new medicines, let your obstetrician know about the visit and how you are doing. Follow up with your health care provider as directed. °· After you have recovered from your asthma attack, make an appointment with your outpatient doctor to talk about ways to reduce the likelihood of future attacks. If you do not have a doctor who manages your asthma, make an appointment with a primary care doctor to discuss your asthma. °SEEK IMMEDIATE MEDICAL CARE IF:  °· You are getting worse. °· You have trouble breathing. If severe, call your local   emergency services (911 in the [place] °· You develop chest pain or discomfort. °· You are vomiting. °· You are not able to keep fluids down. °· You are coughing up yellow, green, brown, or bloody sputum. °· You have a fever and your symptoms suddenly get worse. °· You have  trouble swallowing. °MAKE SURE YOU:  °· Understand these instructions. °· Will watch your condition. °· Will get help right away if you are not doing well or get worse. °  °This information is not intended to replace advice given to you by your health care provider. Make sure you discuss any questions you have with your health care provider. °  °Document Released: 02/03/2007 Document Revised: 10/24/2013 Document Reviewed: 04/26/2013 °[employer] Interactive Patient Education ©2016 [employer] ° °Asthma Attack Prevention °While you may not be able to control the fact that you have asthma, you can take actions to prevent asthma attacks. The best way to prevent asthma attacks is to maintain good control of your asthma. You can achieve this by: °· Taking your medicines as directed. °· Avoiding things that can irritate your airways or make your asthma symptoms worse (asthma triggers). °· Keeping track of how well your asthma is controlled and of any changes in your symptoms. °· Responding quickly to worsening asthma symptoms (asthma attack). °· Seeking emergency care when it is needed. °WHAT ARE SOME WAYS TO PREVENT AN ASTHMA ATTACK? °Have a Plan °Work with your health care provider to create a written plan for managing and treating your asthma attacks (asthma action plan). This plan includes: °· A list of your asthma triggers and how you can avoid them. °· Information on when medicines should be taken and when their dosages should be changed. °· The use of a device that measures how well your lungs are working (peak flow meter). °Monitor Your Asthma °Use your peak flow meter and record your results in a journal every day. A drop in your peak flow numbers on one or more days may indicate the start of an asthma attack. This can happen even before you start to feel symptoms. You can prevent an asthma attack from getting worse by following the steps in your asthma action plan. °Avoid Asthma Triggers °Work with your asthma  health care provider to find out what your asthma triggers are. This can be done by: °· Allergy testing. °· Keeping a journal that notes when asthma attacks occur and the factors that may have contributed to them. °· Determining if there are other medical conditions that are making your asthma worse. °Once you have determined your asthma triggers, take steps to avoid them. This may include avoiding excessive or prolonged exposure to: °· Dust. Have someone dust and vacuum your home for you once or twice a week. Using a high-efficiency particulate arrestance (HEPA) vacuum is best. °· Smoke. This includes campfire smoke, forest fire smoke, and secondhand smoke from tobacco products. °· Pet dander. Avoid contact with animals that you know you are allergic to. °· Allergens from trees, grasses or pollens. Avoid spending a lot of time outdoors when pollen counts are high, and on very windy days. °· Very cold, dry, or humid air. °· Mold. °· Foods that contain high amounts of sulfites. °· Strong odors. °· Outdoor air pollutants, such as engine exhaust. °· Indoor air pollutants, such as aerosol sprays and fumes from household cleaners. °· Household pests, including dust mites and cockroaches, and pest droppings. °· Certain medicines, including NSAIDs. Always talk to your health   care provider before stopping or starting any new medicines. °Medicines °Take over-the-counter and prescription medicines only as told by your health care provider. Many asthma attacks can be prevented by carefully following your medicine schedule. Taking your medicines correctly is especially important when you cannot avoid certain asthma triggers. °Act Quickly °If an asthma attack does happen, acting quickly can decrease how severe it is and how long it lasts. Take these steps:  °· Pay attention to your symptoms. If you are coughing, wheezing, or having difficulty breathing, do not wait to see if your symptoms go away on their own. Follow your asthma  action plan. °· If you have followed your asthma action plan and your symptoms are not improving, call your health care provider or seek immediate medical care at the nearest hospital. °It is important to note how often you need to use your fast-acting rescue inhaler. If you are using your rescue inhaler more often, it may mean that your asthma is not under control. Adjusting your asthma treatment plan may help you to prevent future asthma attacks and help you to gain better control of your condition. °HOW CAN I PREVENT AN ASTHMA ATTACK WHEN I EXERCISE? °Follow advice from your health care provider about whether you should use your fast-acting inhaler before exercising. Many people with asthma experience exercise-induced bronchoconstriction (EIB). This condition often worsens during vigorous exercise in cold, humid, or dry environments. Usually, people with EIB can stay very active by pre-treating with a fast-acting inhaler before exercising. °  °This information is not intended to replace advice given to you by your health care provider. Make sure you discuss any questions you have with your health care provider. °  °Document Released: 10/07/2009 Document Revised: 07/10/2015 Document Reviewed: 03/21/2015 °[employer] Interactive Patient Education ©2016 [employer] ° °

## 2016-02-05 NOTE — Progress Notes (Signed)
Patient discharge teaching given, including activity, diet, follow-up appoints, and medications. Patient verbalized understanding of all discharge instructions. IV access was d/c'd. Vitals are stable. Skin is intact except as charted in most recent assessments. Pt to be escorted out by NT, to be driven home by family. 

## 2016-02-07 LAB — CULTURE, BLOOD (ROUTINE X 2)

## 2020-04-02 DEATH — deceased
